# Patient Record
Sex: Female | Born: 1940 | Race: Black or African American | Hispanic: No | Smoking: Former smoker
Health system: Southern US, Community
[De-identification: ages and names within clinical notes are randomized; demographics above are authoritative.]

## PROBLEM LIST (undated history)

## (undated) DIAGNOSIS — E119 Type 2 diabetes mellitus without complications: Secondary | ICD-10-CM

## (undated) DIAGNOSIS — Z8639 Personal history of other endocrine, nutritional and metabolic disease: Secondary | ICD-10-CM

## (undated) DIAGNOSIS — R519 Headache, unspecified: Secondary | ICD-10-CM

## (undated) DIAGNOSIS — E785 Hyperlipidemia, unspecified: Secondary | ICD-10-CM

## (undated) DIAGNOSIS — L309 Dermatitis, unspecified: Secondary | ICD-10-CM

## (undated) DIAGNOSIS — R42 Dizziness and giddiness: Secondary | ICD-10-CM

## (undated) DIAGNOSIS — F419 Anxiety disorder, unspecified: Secondary | ICD-10-CM

## (undated) DIAGNOSIS — G629 Polyneuropathy, unspecified: Secondary | ICD-10-CM

## (undated) DIAGNOSIS — F329 Major depressive disorder, single episode, unspecified: Secondary | ICD-10-CM

## (undated) DIAGNOSIS — F32A Depression, unspecified: Secondary | ICD-10-CM

## (undated) DIAGNOSIS — I011 Acute rheumatic endocarditis: Secondary | ICD-10-CM

## (undated) DIAGNOSIS — I10 Essential (primary) hypertension: Secondary | ICD-10-CM

## (undated) HISTORY — DX: Dermatitis, unspecified: L30.9

## (undated) HISTORY — DX: Polyneuropathy, unspecified: G62.9

## (undated) HISTORY — PX: TONSILLECTOMY: SUR1361

## (undated) HISTORY — DX: Hyperlipidemia, unspecified: E78.5

## (undated) HISTORY — DX: Anxiety disorder, unspecified: F41.9

## (undated) HISTORY — PX: BRAIN SURGERY: SHX531

## (undated) HISTORY — DX: Headache, unspecified: R51.9

## (undated) HISTORY — DX: Essential (primary) hypertension: I10

## (undated) HISTORY — DX: Acute rheumatic endocarditis: I01.1

## (undated) HISTORY — PX: SHOULDER SURGERY: SHX246

## (undated) HISTORY — DX: Depression, unspecified: F32.A

## (undated) HISTORY — DX: Personal history of other endocrine, nutritional and metabolic disease: Z86.39

## (undated) HISTORY — PX: APPENDECTOMY: SHX54

---
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
## 2018-09-25 ENCOUNTER — Ambulatory Visit: Payer: Medicare Other | Admitting: Neurology

## 2018-09-26 ENCOUNTER — Encounter: Payer: Self-pay | Admitting: *Deleted

## 2018-09-30 ENCOUNTER — Encounter: Payer: Self-pay | Admitting: Neurology

## 2018-09-30 ENCOUNTER — Ambulatory Visit: Payer: Medicare Other | Admitting: Neurology

## 2018-09-30 ENCOUNTER — Other Ambulatory Visit: Payer: Self-pay

## 2018-09-30 VITALS — BP 111/66 | HR 77 | Temp 97.5°F | Ht 62.5 in | Wt 194.6 lb

## 2018-09-30 DIAGNOSIS — D8989 Other specified disorders involving the immune mechanism, not elsewhere classified: Secondary | ICD-10-CM | POA: Diagnosis not present

## 2018-09-30 DIAGNOSIS — M359 Systemic involvement of connective tissue, unspecified: Secondary | ICD-10-CM

## 2018-09-30 DIAGNOSIS — E785 Hyperlipidemia, unspecified: Secondary | ICD-10-CM

## 2018-09-30 DIAGNOSIS — R29898 Other symptoms and signs involving the musculoskeletal system: Secondary | ICD-10-CM

## 2018-09-30 DIAGNOSIS — Z79899 Other long term (current) drug therapy: Secondary | ICD-10-CM

## 2018-09-30 DIAGNOSIS — G990 Autonomic neuropathy in diseases classified elsewhere: Secondary | ICD-10-CM

## 2018-09-30 DIAGNOSIS — G908 Other disorders of autonomic nervous system: Secondary | ICD-10-CM

## 2018-09-30 DIAGNOSIS — G629 Polyneuropathy, unspecified: Secondary | ICD-10-CM

## 2018-09-30 DIAGNOSIS — M0609 Rheumatoid arthritis without rheumatoid factor, multiple sites: Secondary | ICD-10-CM

## 2018-09-30 DIAGNOSIS — E1369 Other specified diabetes mellitus with other specified complication: Secondary | ICD-10-CM

## 2018-09-30 DIAGNOSIS — R42 Dizziness and giddiness: Secondary | ICD-10-CM

## 2018-09-30 NOTE — Patient Instructions (Addendum)
Blood work today, come back for cholesterol panel Stop Statin Temporarily 2 months Refer to Rheumatology Refer to physical therapy Continue the IVIG

## 2018-09-30 NOTE — Progress Notes (Signed)
°[facility] ° ° ° °Provider:  Dr Ahern °Requesting Provider: White, David L, MD °Primary Care Provider:  Hagler, Rachel, MD ° °CC:  neuropathy ° °HPI:  Sarah Whitaker is a 78 y.o. female here as requested by White, David L, MD for neuropathy.  Past medical history diabetes, rheumatoid arthritis, hyperlipidemia, hypertension. 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 She can walk now, not using the cane as much, She never had pain more weakness in the legs, started acutely confirmed by extensive workup by neurology in [place]. She still feels weak but significantly improved. She never had sensory changes, all weakness, in the legs but not in the hands and did not affect the hands. Denies any numbness. Ankles stil stiff. Prior to acute symptoms no changes, no changes in medications, no falls. Doing well otherwise.  ° °Reviewed notes, labs and imaging from outside physicians, which showed: ° °Reviewed notes from [facility] [place] in [place].  Patient is new to the area and is establishing care.  Patient presented with bilateral acute leg weakness.  She has a past medical history of hyperlipidemia, diabetes and rheumatoid arthritis who presented for evaluation of new weakness in March 2020.  In January 2020 she developed relatively acute to subacute onset of weakness in both lower extremities.  This began without any clear provocation but is worsened over the month.  Now experiencing moderately severe constant and chronic weakness of both lower extremities.  Her weakness is worse when she is on her feet and fully goes away.  She is having to use cane to walk and prior to January she was able to walk without any assistance.  No family history of similar things.  She has no prior history of similar symptoms.  No changes in sensation such as weakness, tingling or numbness.  No significant  pain in the back of the legs themselves.  Right side appears more affected than the left but both sides are affected.  She underwent a laboratory evaluation largely normal with a CK of 325.  She is on lovastatin and methotrexate.  She is retired.  She is right-handed.  Neurologic exam was unremarkable except for 4- out of 5 strength in the bilateral tibialis anterior, no atrophy or fasciculations noted, intact to light touch, pinprick and vibration, no dysmetria, normal toe and heel walking normal tandem great, absent at ankles but otherwise DTRs 2+.  EMG nerve conduction study was obtained.  So was MRI of the lumbar spine.  She is on methotrexate for rheumatoid arthritis.  She was continued on lovastatin. ° °Personally reviewed MRI of the low spine which showed as above noted.  Reviewed EMG nerve conduction study data the bilateral tibialis anterior muscles and medial gastroc muscles demonstrated 2+ to 3+ insertional activity with markedly reduced recruitment and increased motor unit amplitude and duration.  The bilateral vastus lateralis and vastus medialis muscles demonstrated rare insertional activity with mildly increased motor unit amplitudes.  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  CTA of the head showed no acute intracranial abnormalities, mild chronic senescent changes, remote left craniotomy and underlying aneurysm repair changes.  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  EMG nerve conduction study showed severe, active in chronic sensorimotor polyneuropathy, inflammatory or autoimmune, toxic or metabolic, or infectious etiology   would appear most likely.  A lumbar puncture was ordered and lab work was ordered.  Lumbar puncture showed slightly elevated glucose at 57. ° °Labs ordered included ANA screen positive titer and  pattern 1-1 60 speckled, double-stranded DNA 48 c-ANCA negative, p-ANCA negative, CRP 2.1, ESR 78, serum immunoelectrophoresis showed a questionable faint monoclonal band present in the gamma region of IgG kappa, Lyme disease negative, paraneoplastic antibody found neuronal ACH R ganglionic neuronal antibodies high. °Review of Systems: °Patient complains of symptoms per HPI as well as the following symptoms ; leg pain and weakness. Pertinent negatives and positives per HPI. All others negative. ° ° °Social History  ° °Socioeconomic History  °• Marital status: Single  °  Spouse name: Not on file  °• Number of children: 1  °• Years of education: Not on file  °• Highest education level: Not on file  °Occupational History  °  Comment: retired  °Social Needs  °• Financial resource strain: Not on file  °• Food insecurity  °  Worry: Not on file  °  Inability: Not on file  °• Transportation needs  °  Medical: Not on file  °  Non-medical: Not on file  °Tobacco Use  °• Smoking status: Former Smoker  °• Smokeless tobacco: Never Used  °Substance and Sexual Activity  °• Alcohol use: Never  °  Frequency: Never  °• Drug use: Never  °• Sexual activity: Not on file  °Lifestyle  °• Physical activity  °  Days per week: Not on file  °  Minutes per session: Not on file  °• Stress: Not on file  °Relationships  °• Social connections  °  Talks on phone: Not on file  °  Gets together: Not on file  °  Attends religious service: Not on file  °  Active member of club or organization: Not on file  °  Attends meetings of clubs or organizations: Not on file  °  Relationship status: Not on file  °• Intimate partner violence  °  Fear of current or ex partner: Not on file  °  Emotionally abused: Not on file  °  Physically abused: Not on file  °  Forced sexual activity: Not on file  °Other Topics Concern  °• Not on file  °Social History Narrative  °• Not on file  ° ° °History reviewed. No pertinent family history. ° °Past Medical History:  °Diagnosis  Date  °• Anxiety   °• Depression   °• Diabetes (HCC)   °• Headache   °• Hyperlipidemia   °• Hypertension   °• Neuropathy   °• Rheumatoid aortitis   ° ° °There are no active problems to display for this patient. ° ° °Past Surgical History:  °Procedure Laterality Date  °• APPENDECTOMY    °• BRAIN SURGERY    °• SHOULDER SURGERY Right   ° ° °Current Outpatient Medications  °Medication Sig Dispense Refill  °• aspirin EC 81 MG tablet Take 81 mg by mouth daily.    °• atorvastatin (LIPITOR) 20 MG tablet Take 20 mg by mouth daily.    °• b complex vitamins tablet Take 1 tablet by mouth daily.    °• folic acid (FOLVITE) 1 MG tablet Take 1 mg by mouth daily.    °• lisinopril-hydrochlorothiazide (ZESTORETIC) 20-25 MG tablet Take 1 tablet by mouth daily.    °• metFORMIN (GLUCOPHAGE) 500 MG tablet Take 500 mg by mouth daily.     °• methotrexate (RHEUMATREX) 2.5 MG tablet   Take 2.5 mg by mouth once a week. Caution:Chemotherapy. Protect from light. °Take 7 tabs weekly    °• UNABLE TO FIND Take 1 tablet by mouth daily. Med Name: Gamma Guard    °• calcium carbonate (TUMS - DOSED IN MG ELEMENTAL CALCIUM) 500 MG chewable tablet Chew 1 tablet by mouth daily.    °• lovastatin (MEVACOR) 20 MG tablet Take 20 mg by mouth at bedtime.    °• meloxicam (MOBIC) 15 MG tablet Take 15 mg by mouth daily.    ° °No current facility-administered medications for this visit.   ° ° °Allergies as of 09/30/2018 - Review Complete 09/30/2018  °Allergen Reaction Noted  °• Latex  09/26/2018  °• Other  09/26/2018  °• Penicillins  09/26/2018  °• Sulfa antibiotics  09/26/2018  °• Tetanus toxoids  09/26/2018  ° ° °Vitals: °BP 111/66    Pulse 77    Temp (!) 97.5 °F (36.4 °C) (Oral)    Ht 5' 2.5" (1.588 m)    Wt 194 lb 9.6 oz (88.3 kg)    BMI 35.03 kg/m²  °Last Weight:  °Wt Readings from Last 1 Encounters:  °09/30/18 194 lb 9.6 oz (88.3 kg)  ° °Last Height:   °Ht Readings from Last 1 Encounters:  °09/30/18 5' 2.5" (1.588 m)  ° ° ° °Physical exam: °Exam: °Gen: NAD,  conversant, well nourised, obese, well groomed                     °CV: RRR, no MRG. No Carotid Bruits. No peripheral edema, warm, nontender °Eyes: Conjunctivae clear without exudates or hemorrhage ° °Neuro: °Detailed Neurologic Exam ° °Speech: °   Speech is normal; fluent and spontaneous with normal comprehension.  °Cognition: ° °  The patient is oriented to person, place, and time;  °   recent and remote memory intact;  °   language fluent;  °   normal attention, concentration,  °   fund of knowledge °Cranial Nerves: °   The pupils are equal, round, and reactive to light. Attempted fundoscopic exam could not visualize due to small pupils.  Visual fields are full to finger confrontation. Extraocular movements are intact. Trigeminal sensation is intact and the muscles of mastication are normal. The face is symmetric. The palate elevates in the midline. Hearing intact. Voice is normal. Shoulder shrug is normal. The tongue has normal motion without fasciculations.  ° °Coordination: °   No dysmetria ° °Gait: °   Slightly wide based with imbalance, slowed and cautious with cane ° °Motor Observation: °   No asymmetry, no atrophy, and no involuntary movements noted. °Tone: °   Normal muscle tone.   ° °Posture: °   Posture is normal. normal erect °   °Strength: °   Strength is V/V in the upper and lower limbs.  °    °Sensation: intact to LT °    °Reflex Exam: ° °DTR's: °   Absent AJs otherwise deep tendon reflexes in the upper and lower extremities are normal bilaterally.   °Toes: °   The toes are downgoing bilaterally.   °Clonus: °   Clonus is absent. °  ° °Assessment/Plan: 78-year-old patient with a past medical history of diabetes, hyperlipidemia, hypertension, rheumatoid arthritis on methotrexate who was previously managed by neurology for acute to subacute onset of weakness in the legs, EMG nerve conduction study demonstrated a relatively severe, active and chronic sensorimotor polyneuropathy, and  inflammatory/autoimmune/toxic/metabolic and infectious work-up was completed which revealed elevated ESR   ESR CRP, positive ANA and weakly positive SPEP IgG likely related to her known rheumatoid arthritis.  Also found to have a mildly elevated a CHR ganglionic neuronal antibody.  Her LP showed elevated protein of 57 but otherwise normal.  She was treated with IVIG.  Try temporary stopping the statin but has to watch cholesterol very closely. Repeat some labwork come back in the morning. She has a pcp appointment in August and can discuss at that time, I will let Dr. Mannie Stabile know. Can restart statin in a few months and see if symptoms worsen. In the meantime continue the IVIG.  Please teach Epley Maneuvers for BPPV and also address leg weakness now on IVIG for autoimmune neuropathy  Orders Placed This Encounter  Procedures   CK   B12 and Folate Panel   Vitamin B1   Methylmalonic acid, serum   Sedimentation rate   RPR   Heavy metals, blood   Vitamin B6   Multiple Myeloma Panel (SPEP&IFE w/QIG)   CBC   Comprehensive metabolic panel   Lipid panel   Hemoglobin A1c   TSH   Ambulatory referral to Rheumatology   Ambulatory referral to Physical Therapy     Cc: Ezequiel Essex, MD,    Sarina Ill, MD  [facility] [address] [place], [place] [address]  Phone [phone] Fax [phone]

## 2018-10-03 LAB — COMPREHENSIVE METABOLIC PANEL
ALT: 30 IU/L (ref 0–32)
AST: 41 IU/L — ABNORMAL HIGH (ref 0–40)
Albumin/Globulin Ratio: 1 — ABNORMAL LOW (ref 1.2–2.2)
Albumin: 3.9 g/dL (ref 3.7–4.7)
Alkaline Phosphatase: 94 IU/L (ref 39–117)
BUN/Creatinine Ratio: 17 (ref 12–28)
BUN: 12 mg/dL (ref 8–27)
Bilirubin Total: 0.7 mg/dL (ref 0.0–1.2)
CO2: 20 mmol/L (ref 20–29)
Calcium: 9.4 mg/dL (ref 8.7–10.3)
Chloride: 96 mmol/L (ref 96–106)
Creatinine, Ser: 0.72 mg/dL (ref 0.57–1.00)
GFR calc Af Amer: 94 mL/min/{1.73_m2} (ref >59)
GFR calc non Af Amer: 82 mL/min/{1.73_m2} (ref >59)
Globulin, Total: 4 g/dL (ref 1.5–4.5)
Glucose: 67 mg/dL (ref 65–99)
Potassium: 4.1 mmol/L (ref 3.5–5.2)
Sodium: 135 mmol/L (ref 134–144)
Total Protein: 7.9 g/dL (ref 6.0–8.5)

## 2018-10-03 LAB — MULTIPLE MYELOMA PANEL, SERUM
Albumin SerPl Elph-Mcnc: 3.7 g/dL (ref 2.9–4.4)
Albumin/Glob SerPl: 0.9 (ref 0.7–1.7)
Alpha 1: 0.3 g/dL (ref 0.0–0.4)
Alpha2 Glob SerPl Elph-Mcnc: 0.4 g/dL (ref 0.4–1.0)
B-Globulin SerPl Elph-Mcnc: 1.3 g/dL (ref 0.7–1.3)
Gamma Glob SerPl Elph-Mcnc: 2.2 g/dL — ABNORMAL HIGH (ref 0.4–1.8)
Globulin, Total: 4.2 g/dL — ABNORMAL HIGH (ref 2.2–3.9)
IgA/Immunoglobulin A, Serum: 362 mg/dL (ref 64–422)
IgG (Immunoglobin G), Serum: 2470 mg/dL — ABNORMAL HIGH (ref 586–1602)
IgM (Immunoglobulin M), Srm: 60 mg/dL (ref 26–217)

## 2018-10-03 LAB — CK: Total CK: 314 U/L — ABNORMAL HIGH (ref 32–182)

## 2018-10-03 LAB — CBC
Hematocrit: 29 % — ABNORMAL LOW (ref 34.0–46.6)
Hemoglobin: 10 g/dL — ABNORMAL LOW (ref 11.1–15.9)
MCH: 34.5 pg — ABNORMAL HIGH (ref 26.6–33.0)
MCHC: 34.5 g/dL (ref 31.5–35.7)
MCV: 100 fL — ABNORMAL HIGH (ref 79–97)
Platelets: 269 10*3/uL (ref 150–450)
RBC: 2.9 x10E6/uL — ABNORMAL LOW (ref 3.77–5.28)
RDW: 17.9 % — ABNORMAL HIGH (ref 11.7–15.4)
WBC: 3.4 10*3/uL (ref 3.4–10.8)

## 2018-10-03 LAB — HEAVY METALS, BLOOD
Arsenic: 7 ug/L (ref 2–23)
Lead, Blood: NOT DETECTED ug/dL (ref 0–4)
Mercury: 1.4 ug/L (ref 0.0–14.9)

## 2018-10-03 LAB — METHYLMALONIC ACID, SERUM: Methylmalonic Acid: 124 nmol/L (ref 0–378)

## 2018-10-03 LAB — TSH: TSH: 2.04 u[IU]/mL (ref 0.450–4.500)

## 2018-10-03 LAB — VITAMIN B6: Vitamin B6: 9.1 ug/L (ref 2.0–32.8)

## 2018-10-03 LAB — B12 AND FOLATE PANEL
Folate: >20 ng/mL (ref >3.0)
Vitamin B-12: 1154 pg/mL (ref 232–1245)

## 2018-10-03 LAB — RPR: RPR Ser Ql: NONREACTIVE

## 2018-10-03 LAB — SEDIMENTATION RATE: Sed Rate: 51 mm/hr — ABNORMAL HIGH (ref 0–40)

## 2018-10-03 LAB — HEMOGLOBIN A1C
Est. average glucose Bld gHb Est-mCnc: <74 mg/dL
Hgb A1c MFr Bld: <4.2 % — ABNORMAL LOW (ref 4.8–5.6)

## 2018-10-03 LAB — VITAMIN B1: Thiamine: 176.4 nmol/L (ref 66.5–200.0)

## 2018-10-07 ENCOUNTER — Telehealth: Payer: Self-pay | Admitting: *Deleted

## 2018-10-07 NOTE — Telephone Encounter (Signed)
Spoke with pt and discussed lab results. She understands to follow up with PCP for anemia. She also understands the CK was slightly elevated but we will recheck at next appt and there was nothing concerning. Statin has been stopped. She verbalized appreciation. She wants to make sure she can get her fasting lipid panel checked this week while getting gammaguard treatments this week. She plans to come Wednesday. Pt also reported she was upset that she drove over here last Friday to get her lab and was unaware the office was closed. Stated the recording says open. Pt understands the office is open for calls only on Fridays from 8-12 and message would be passed to management per her request. She verbalized appreciation.

## 2018-10-07 NOTE — Telephone Encounter (Signed)
I would wait until she is done the treatment, I'm not sure if it will interfere with the lipid panel. I would wait a few weeks after the treatment thanks.

## 2018-10-07 NOTE — Telephone Encounter (Signed)
-----   Message from Melvenia Beam, MD sent at 10/05/2018  7:38 PM EDT ----- Patient is anemic, needs follow up with primary care. Otherwise CK is slightly elevated but we stopped her statin and we will recheck her muscle enzymes at next appointment. Nothing concenring. thanks

## 2018-10-08 ENCOUNTER — Telehealth: Payer: Self-pay

## 2018-10-08 NOTE — Telephone Encounter (Signed)
I called the pt and LVM (ok per DPR) advising per Dr. Jaynee Eagles, she would wait a few weeks after the gammaguard treatment to get the lipid panel drawn as she is unsure if this would interefere with the lipid panel. I left office number for call back if she has any questions. I encouraged her to call ahead before getting the lab to confirm lab hours.

## 2018-10-08 NOTE — Telephone Encounter (Signed)
Left a voicemail asking the patient to obtain her records from Rheumatology so that Dr. Jaynee Eagles can look at them. I asked that she return my call with an update. Office number was provided.

## 2018-10-14 ENCOUNTER — Telehealth: Payer: Self-pay | Admitting: *Deleted

## 2018-10-14 NOTE — Telephone Encounter (Signed)
R/C medical records from [facility]. Pt medical records on [place]. Pt has 12/16/18.

## 2018-10-24 ENCOUNTER — Other Ambulatory Visit: Payer: Self-pay

## 2018-10-24 ENCOUNTER — Ambulatory Visit: Payer: Medicare Other | Attending: Neurology | Admitting: Rehabilitative and Restorative Service Providers"

## 2018-10-24 ENCOUNTER — Encounter: Payer: Self-pay | Admitting: Rehabilitative and Restorative Service Providers"

## 2018-10-24 DIAGNOSIS — M6281 Muscle weakness (generalized): Secondary | ICD-10-CM | POA: Insufficient documentation

## 2018-10-24 DIAGNOSIS — R2689 Other abnormalities of gait and mobility: Secondary | ICD-10-CM | POA: Diagnosis not present

## 2018-10-24 DIAGNOSIS — R2681 Unsteadiness on feet: Secondary | ICD-10-CM | POA: Insufficient documentation

## 2018-10-24 NOTE — Patient Instructions (Signed)
Access Code: [email or web address]  URL: [email or web address]  Date: 10/24/2018  Prepared by: Rudell Cobb   Exercises Gastroc Stretch on Wall - 2 reps - 1 sets - 30 seconds hold - 1-2x daily - 7x weekly Seated Toe Raise - 10 reps - 2 sets - 3 seconds hold - 1-2x daily - 7x weekly Seated Heel Raise - 10 reps - 2 sets - 3 seconds hold - 1-2x daily - 7x weekly Sit to Stand with Armchair - 5 reps - 2 sets - 1-2x daily - 7x weekly

## 2018-10-24 NOTE — Therapy (Signed)
[facility] [address] [place], [place], [address] Phone: [phone]   Fax:  [phone]  Physical Therapy Evaluation  Patient Details  Name: Sarah Whitaker MRN: [id number] Date of Birth: 1940-11-18 Referring Provider (PT): Sarina Ill, MD  CLINIC OPERATION CHANGES: Outpatient Neuro Rehab is open at lower capacity following universal masking, social distancing, and patient screening.  The patient's COVID risk of complications score is 5.  Encounter Date: 10/24/2018  PT End of Session - 10/24/18 1537    Visit Number  1    Number of Visits  17    Date for PT Re-Evaluation  12/23/18    Authorization Type  [employer] medicare    PT Start Time  6063    PT Stop Time  1320    PT Time Calculation (min)  45 min    Equipment Utilized During Treatment  Gait belt    Activity Tolerance  Patient tolerated treatment well    Behavior During Therapy  WFL for tasks assessed/performed       Past Medical History:  Diagnosis Date  . Anxiety   . Depression   . Diabetes ([place])   . Headache   . Hyperlipidemia   . Hypertension   . Neuropathy   . Rheumatoid aortitis     Past Surgical History:  Procedure Laterality Date  . APPENDECTOMY    . BRAIN SURGERY    . SHOULDER SURGERY Right     There were no vitals filed for this visit.   Subjective Assessment - 10/24/18 1234    Subjective  The patinet reports she had vertigo x 6 -8 weeks and it has resolved.  She came in today to have her balance and strength assessed.  She reports "because of the vertigo, "I slept on my left side for 2 months and now the L shoulder won't stop hurting."  She denies any injury or strain.  The patient moved from [place] to be near her daughter.    Pertinent History  diabetes, rheumatoid arthritis, hyperlipidemia, hypertension. She was diagnosed with autoimmune/inflammatory polyneuropathy    Patient Stated Goals  She notes she has gotten better since starting  treatments for neuropathy.  Some days she is stronger than others and would like to get more consistent.  She did not need a cane before onset of neuropathy.    Currently in Pain?  Yes    Pain Location  Shoulder    Pain Orientation  Left    Pain Descriptors / Indicators  Aching    Pain Type  Acute pain    Pain Onset  1 to 4 weeks ago    Pain Frequency  Intermittent    Aggravating Factors   rotation, positioning it    Pain Relieving Factors  rest         [facility] PT Assessment - 10/24/18 1240      Assessment   Medical Diagnosis  vertigo, recent dx of polyneuropathy    Referring Provider (PT)  Sarina Ill, MD    Onset Date/Surgical Date  --   January 2020   Hand Dominance  Right    Prior Therapy  Yes, in [place].       Precautions   Precautions  Fall      Balance Screen   Has the patient fallen in the past 6 months  Yes    How many times?  1- slipped in the bathtub in March    Has the patient had a decrease in activity  level because of a fear of falling?   No   beginning to improve   Is the patient reluctant to leave their home because of a fear of falling?   No      Home Environment   Living Environment  Private residence    Living Arrangements  Children    Home Access  Level entry    Home Layout  One level    Home Equipment  Cane - quad;Tub bench      Prior Function   Level of Independence  Independent    Vocation  Retired      Education administrator  Appears Intact      ROM / Strength   AROM / PROM / Strength  AROM;Strength      AROM   Overall AROM   Within functional limits for tasks performed    Overall AROM Comments  Has slower movement on the L for shoulder ER, but able to complete with mild achiness in deltoid region      Strength   Strength Assessment Site  Shoulder;Elbow;Hip;Knee;Ankle    Right/Left Shoulder  Right;Left    Right Shoulder Flexion  4+/5    Right Shoulder ABduction  4+/5    Left Shoulder Flexion  3+/5   pain   Left Shoulder ABduction   3+/5   pain   Right/Left Elbow  Right;Left    Right Elbow Flexion  5/5    Right Elbow Extension  5/5    Left Elbow Flexion  5/5    Left Elbow Extension  5/5    Right/Left Hip  Right;Left    Right Hip Flexion  4/5    Left Hip Flexion  4/5    Right/Left Knee  Right;Left    Right Knee Flexion  5/5    Right Knee Extension  4/5    Left Knee Flexion  5/5    Left Knee Extension  5/5    Right/Left Ankle  Right;Left    Right Ankle Dorsiflexion  3-/5    Left Ankle Dorsiflexion  3-/5      Ambulation/Gait   Ambulation/Gait  Yes    Ambulation/Gait Assistance  4: Min guard    Ambulation Distance (Feet)  200 Feet    Assistive device  Small based quad cane    Gait Pattern  Decreased stride length;Poor foot clearance - left;Poor foot clearance - right;Shuffle;Left foot flat;Right foot flat;Decreased dorsiflexion - left;Decreased dorsiflexion - right;Step-through pattern    Gait velocity  1.46 ft/sec     Stairs  Yes    Stairs Assistance  4: Min guard    Stair Management Technique  Alternating pattern;Two rails;Step to pattern      Standardized Balance Assessment   Standardized Balance Assessment  Berg Balance Test      Berg Balance Test   Sit to Stand  Able to stand without using hands and stabilize independently    Standing Unsupported  Able to stand safely 2 minutes    Sitting with Back Unsupported but Feet Supported on Floor or Stool  Able to sit safely and securely 2 minutes    Stand to Sit  Sits safely with minimal use of hands    Transfers  Able to transfer safely, definite need of hands    Standing Unsupported with Eyes Closed  Able to stand 10 seconds with supervision    Standing Unsupported with Feet Together  Able to place feet together independently and stand 1 minute safely  From Standing, Reach Forward with Outstretched Arm  Can reach forward >5 cm safely (2")    From Standing Position, Pick up Object from Floor  Able to pick up shoe, needs supervision    From Standing  Position, Turn to Look Behind Over each Shoulder  Turn sideways only but maintains balance    Turn 360 Degrees  Needs close supervision or verbal cueing    Standing Unsupported, Alternately Place Feet on Step/Stool  Able to complete >2 steps/needs minimal assist    Standing Unsupported, One Foot in Front  Able to take small step independently and hold 30 seconds    Standing on One Leg  Unable to try or needs assist to prevent fall    Total Score  37    Berg comment:  37/56 indicating high fall risk           Vestibular Assessment - 10/24/18 1302      Vestibular Assessment   General Observation  The patient reports vertigo resolved.        Vestibulo-Ocular Reflex   VOR 1 Head Only (x 1 viewing)  slow VOR- able to perform without c/o dizziness      Positional Testing   Sidelying Test  Sidelying Right;Sidelying Left      Sidelying Right   Sidelying Right Duration  0    Sidelying Right Symptoms  No nystagmus      Sidelying Left   Sidelying Left Duration  0    Sidelying Left Symptoms  No nystagmus          Objective measurements completed on examination: See above findings.              PT Education - 10/24/18 1536    Education Details  medbridge ther ex    Person(s) Educated  Patient    Methods  Explanation;Demonstration;Handout    Comprehension  Verbalized understanding;Returned demonstration       PT Short Term Goals - 10/24/18 1538      PT SHORT TERM GOAL #1   Title  The patient will be indep with HEP for LE strengthening, balance, and general mobility.    Time  4    Period  Weeks    Target Date  11/23/18      PT SHORT TERM GOAL #2   Title  The patient will improve Berg score from 37/56 to > or equal to 43/56 to demo improving balance.    Time  4    Period  Weeks    Target Date  11/23/18      PT SHORT TERM GOAL #3   Title  The patient will improve gait speed from 1.46 ft/sec up to 1.8 ft/sec to demo reduced risk for falls.    Time  4     Period  Weeks    Target Date  11/23/18      PT SHORT TERM GOAL #4   Title  The patient will improve ankle DF to > or equal to 4/5 to be able to improve foot clearance during gait.    Time  4    Period  Weeks    Target Date  11/23/18        PT Long Term Goals - 10/24/18 1553      PT LONG TERM GOAL #1   Title  The patient will be indep with HEP for post d/c progression.    Time  8    Period  Weeks    Target Date  12/23/18      PT LONG TERM GOAL #2   Title  The patient will be able to improve Berg score from 37/56 to > or equal to 48/56 to demo dec'ing risk for falls.    Time  8    Period  Weeks    Target Date  12/23/18      PT LONG TERM GOAL #3   Title  The patient will improve gait speed from 1.46 ft/sec to > or equal to 2.2 ft/sec to demo improving gait speed.    Time  8    Period  Weeks    Target Date  12/23/18      PT LONG TERM GOAL #4   Title  The patient will negotiate 4 steps with one handrail and alternating pattern mod indep.    Time  8    Period  Weeks    Target Date  12/23/18             Plan - 10/24/18 1555    Clinical Impression Statement  The patient is a 78 year old female presenting to outpatient physical therapy with polyneuropathy onset in 03/2018.  She was referred for vertigo, however reports her main goals at this time are to improve balance and mobility.  She notes vertigo is resolved.  She scores in fall risk range per gait speed, Berg balance test and has LE weakness more pronounced in hip flexors and ankle DF/PF.  PT to address deficits to promote improved mobility.    Personal Factors and Comorbidities  Comorbidity 1;Comorbidity 2;Comorbidity 3+    Comorbidities  diabetes, RA, HTN    Examination-Activity Limitations  Locomotion Level;Transfers;Stairs    Examination-Participation Restrictions  Community Activity;Cleaning    Stability/Clinical Decision Making  Stable/Uncomplicated    Clinical Decision Making  Low    Rehab Potential  Good     PT Frequency  1x / week   *Patient requested 1x/week due to $35 copay   PT Duration  8 weeks    PT Treatment/Interventions  ADLs/Self Care Home Management;Neuromuscular re-education;Patient/family education;Gait training;Stair training;Functional mobility training;Therapeutic activities;Therapeutic exercise;Balance training;Vestibular    PT Next Visit Plan  Check on HEP, work on ankle strength and hip strengthening, balance activities in standing, work on safe use of assistive device.    Consulted and Agree with Plan of Care  Patient       Patient will benefit from skilled therapeutic intervention in order to improve the following deficits and impairments:  Abnormal gait, Decreased activity tolerance, Decreased strength, Pain, Decreased balance, Difficulty walking  Visit Diagnosis: Other abnormalities of gait and mobility  Muscle weakness (generalized)  Unsteadiness on feet     Problem List There are no active problems to display for this patient.   Valorie Mcgrory, PT 10/24/2018, 4:02 PM  [facility] [address] [place], [place], [address] Phone: [phone]   Fax:  [phone]  Name: Ivar BuryRamona Delaurentis MRN: [id number] Date of Birth: December 28, 1940

## 2018-10-31 ENCOUNTER — Ambulatory Visit: Payer: Medicare Other | Admitting: Rehabilitative and Restorative Service Providers"

## 2018-10-31 ENCOUNTER — Other Ambulatory Visit: Payer: Self-pay

## 2018-10-31 ENCOUNTER — Encounter: Payer: Self-pay | Admitting: Rehabilitative and Restorative Service Providers"

## 2018-10-31 DIAGNOSIS — R2689 Other abnormalities of gait and mobility: Secondary | ICD-10-CM | POA: Diagnosis not present

## 2018-10-31 DIAGNOSIS — R2681 Unsteadiness on feet: Secondary | ICD-10-CM

## 2018-10-31 DIAGNOSIS — M6281 Muscle weakness (generalized): Secondary | ICD-10-CM

## 2018-10-31 NOTE — Patient Instructions (Addendum)
Access Code: [email or web address]  URL: [email or web address]  Date: 10/31/2018  Prepared by: Rudell Cobb   Exercises Gastroc Stretch on Wall - 2 reps - 1 sets - 30 seconds hold - 1-2x daily - 7x weekly Seated Toe Raise - 10 reps - 2 sets - 3 seconds hold - 1-2x daily - 7x weekly Seated Heel Raise - 10 reps - 2 sets - 3 seconds hold - 1-2x daily - 7x weekly Sit to Stand with Armchair - 5 reps - 2 sets - 1-2x daily - 7x weekly                  Supine Bridge - 10 reps - 1 sets - 5 seconds hold - 1-2x daily - 7x weekly  Single point cane with quad tip:

## 2018-10-31 NOTE — Therapy (Signed)
[facility] [address] [place], [place], [address] Phone: [phone]   Fax:  [phone]  Physical Therapy Treatment  Patient Details  Name: Sarah Whitaker MRN: [id number] Date of Birth: 1940/11/25 Referring Provider (PT): Naomie Dean, MD   Encounter Date: 10/31/2018  PT End of Session - 10/31/18 1911    Visit Number  2    Number of Visits  17    Date for PT Re-Evaluation  12/23/18    Authorization Type  [employer]HC medicare    PT Start Time  1704    PT Stop Time  1750    PT Time Calculation (min)  46 min    Equipment Utilized During Treatment  Gait belt    Activity Tolerance  Patient tolerated treatment well    Behavior During Therapy  [facility] for tasks assessed/performed       Past Medical History:  Diagnosis Date  . Anxiety   . Depression   . Diabetes ([facility])   . Headache   . Hyperlipidemia   . Hypertension   . Neuropathy   . Rheumatoid aortitis     Past Surgical History:  Procedure Laterality Date  . APPENDECTOMY    . BRAIN SURGERY    . SHOULDER SURGERY Right     There were no vitals filed for this visit.  Subjective Assessment - 10/31/18 1707    Subjective  The patient arrives with her daughter.  She reports she has been doing exercises.    Patient Stated Goals  She notes she has gotten better since starting treatments for neuropathy.  Some days she is stronger than others and would like to get more consistent.  She did not need a cane before onset of neuropathy.    Currently in Pain?  No/denies         [facility] PT Assessment - 10/31/18 1725      Ambulation/Gait   Ambulation Distance (Feet)  230 Feet   115 x 3 reps   Assistive device  Small based quad cane;Straight cane    Gait Pattern  --   patient with increased heel strike   Ambulation Surface  Level;Indoor    Stairs  --                   [facility] Adult PT Treatment/Exercise - 10/31/18 1725      Ambulation/Gait   Ambulation/Gait  Yes     Ambulation/Gait Assistance  4: Min assist;4: Min guard    Ambulation/Gait Assistance Details  Focused on hitting heel strike and increasing stride length today.    Gait Comments  PT educated patient and her daughter regarding assistive device use.  She is using quad cane incorrectly and may benefit from single point cane with quad tip.        Neuro Re-ed    Neuro Re-ed Details   Wall bumps:  feet in stride performed standing rocking working o nlifting heels/toes for improved weight shift and transfer for pre-gait activiteis.      Exercises   Exercises  Other Exercises    Other Exercises   Sidelying hip abduction x 12 reps R and L sides, supine bridges x 12 reps, sit<.stand without UE support x 12 reps.  Attempted standing heel raises, however the patient is unable to life against gravity.  The patient and PT performed seated ankle DF/PF.             PT Education - 10/31/18 1910    Education Details  updated HEP adding bridges    Person(s) Educated  Patient;Child(ren)    Methods  Explanation;Demonstration;Handout    Comprehension  Verbalized understanding;Returned demonstration       PT Short Term Goals - 10/24/18 1538      PT SHORT TERM GOAL #1   Title  The patient will be indep with HEP for LE strengthening, balance, and general mobility.    Time  4    Period  Weeks    Target Date  11/23/18      PT SHORT TERM GOAL #2   Title  The patient will improve Berg score from 37/56 to > or equal to 43/56 to demo improving balance.    Time  4    Period  Weeks    Target Date  11/23/18      PT SHORT TERM GOAL #3   Title  The patient will improve gait speed from 1.46 ft/sec up to 1.8 ft/sec to demo reduced risk for falls.    Time  4    Period  Weeks    Target Date  11/23/18      PT SHORT TERM GOAL #4   Title  The patient will improve ankle DF to > or equal to 4/5 to be able to improve foot clearance during gait.    Time  4    Period  Weeks    Target Date  11/23/18         PT Long Term Goals - 10/24/18 1553      PT LONG TERM GOAL #1   Title  The patient will be indep with HEP for post d/c progression.    Time  8    Period  Weeks    Target Date  12/23/18      PT LONG TERM GOAL #2   Title  The patient will be able to improve Berg score from 37/56 to > or equal to 48/56 to demo dec'ing risk for falls.    Time  8    Period  Weeks    Target Date  12/23/18      PT LONG TERM GOAL #3   Title  The patient will improve gait speed from 1.46 ft/sec to > or equal to 2.2 ft/sec to demo improving gait speed.    Time  8    Period  Weeks    Target Date  12/23/18      PT LONG TERM GOAL #4   Title  The patient will negotiate 4 steps with one handrail and alternating pattern mod indep.    Time  8    Period  Weeks    Target Date  12/23/18            Plan - 10/31/18 1911    Clinical Impression Statement  The patinet has shown progress since initial evaluation.  PT to continue to work on progressing gait, LE strength and balance.  We trialed a SPC with quad tip today with improved reciprocal gait.  She carries the quad cane and it is a tripping hazard.  PT discussed device selection with her daughter.    PT Treatment/Interventions  ADLs/Self Care Home Management;Neuromuscular re-education;Patient/family education;Gait training;Stair training;Functional mobility training;Therapeutic activities;Therapeutic exercise;Balance training;Vestibular    PT Next Visit Plan  continue LE strength, balance recovery, rocker board for balance strategies, distal LE strenthening (toe/heel raises), gait with SPC building endurance    Consulted and Agree with Plan of Care  Patient       Patient  will benefit from skilled therapeutic intervention in order to improve the following deficits and impairments:  Abnormal gait, Decreased activity tolerance, Decreased strength, Pain, Decreased balance, Difficulty walking  Visit Diagnosis: Other abnormalities of gait and  mobility  Muscle weakness (generalized)  Unsteadiness on feet     Problem List There are no active problems to display for this patient.   McCool, PT 10/31/2018, 7:13 PM  [facility] [address] [place], [place], [address] Phone: [phone]   Fax:  [phone]  Name: Courteny Egler MRN: [id number] Date of Birth: 11/26/1940

## 2018-11-14 ENCOUNTER — Ambulatory Visit: Payer: Medicare Other | Admitting: Rehabilitative and Restorative Service Providers"

## 2018-11-19 ENCOUNTER — Telehealth: Payer: Self-pay | Admitting: Neurology

## 2018-11-19 ENCOUNTER — Other Ambulatory Visit: Payer: Self-pay | Admitting: Neurology

## 2018-11-19 NOTE — Telephone Encounter (Signed)
Pt states she has been told that she needs to see Dr Jaynee Eagles sooner than later because she will have been over 6 weeks without medication if she waits until current appointment date

## 2018-11-19 NOTE — Telephone Encounter (Signed)
Patient was told to continue the IVIG treatment which she was not getting from [place], that's what my last note said. I know she came from [place] but I recall she was getting IVIG ordered from Rheumatology so I am not sure what she means that she needs medication. Can you clarify where she last got the IVIG? If we need to get her set up then lets start the process asap. thanks

## 2018-11-19 NOTE — Telephone Encounter (Signed)
I spoke with the patient. She stated the treatments ordered by Dr. Dema Severin in [place] have been administered by [facility] which is "nationwide". She stated the orders were transferred to [facility]. Even though the approval was for one year, the pt stated they needed updates from her neurologist from time to time. Per pt, treatments are once a month and are one week long. Her last treatment was the week of 8/31. She does not want to miss the next one. I asked her to have [employer] reach out to [place] if possible so we know what information is needed. Last ov was 09/30/2018 and next ov is scheduled 12/16/2018 just under 3 months later. Pt stated she would call them and she has good contact with them. She will reach back out if needed. She verbalized appreciation.

## 2018-11-20 NOTE — Telephone Encounter (Signed)
I spoke with Dr. Jaynee Eagles and she reviewed the IVIG orders. They have been signed and faxed back to [employer] [facility]. Received a receipt of confirmation.

## 2018-11-20 NOTE — Telephone Encounter (Signed)
We received orders from [employer] in [place] for MD authorization. Phone: [phone]. Fax: [phone].   RX number: [id number].  Original Rx date: 09/18/2018 (Dr. Donovan Kail).  Doses allowed: 3.  Expires: 09/17/2019.   Prescription: GammaGARD 180 gm/1800 mL every month.  Administration instructions: Infuse a total of 180 gm/1800 mL IV divided over 5 days x 3 cycles via infusion pump rate or rate controlled device, infuse per recommended guidelines at rates provided on dosing rate sheet enclosed in medication bag. RN to titrate rate as tolerated by patient. Pre-medication: Acetaminophen 325 mg PO prior to infusion, Diphenhydramine 25 mg PO prior to infusion. Anaphylaxis kit/HH nursings/flushes/supplies per pharmacy protocol.   I will review this with Dr. Jaynee Eagles.

## 2018-11-21 ENCOUNTER — Other Ambulatory Visit: Payer: Self-pay

## 2018-11-21 ENCOUNTER — Encounter: Payer: Self-pay | Admitting: Rehabilitative and Restorative Service Providers"

## 2018-11-21 ENCOUNTER — Ambulatory Visit: Payer: Medicare Other | Attending: Neurology | Admitting: Rehabilitative and Restorative Service Providers"

## 2018-11-21 ENCOUNTER — Telehealth: Payer: Self-pay | Admitting: Neurology

## 2018-11-21 VITALS — BP 116/57 | HR 105

## 2018-11-21 DIAGNOSIS — M6281 Muscle weakness (generalized): Secondary | ICD-10-CM | POA: Insufficient documentation

## 2018-11-21 DIAGNOSIS — R2689 Other abnormalities of gait and mobility: Secondary | ICD-10-CM | POA: Diagnosis not present

## 2018-11-21 DIAGNOSIS — R2681 Unsteadiness on feet: Secondary | ICD-10-CM | POA: Diagnosis present

## 2018-11-21 NOTE — Therapy (Addendum)
[facility] [address] [place], [place], [address] Phone: [phone]   Fax:  [phone]  Physical Therapy Treatment and Discharge Summary  Patient Details  Name: Sarah Whitaker MRN: [id number] Date of Birth: 02-09-1941 Referring Provider (PT): Sarina Ill, MD   Encounter Date: 11/21/2018  PT End of Session - 11/21/18 1824    Visit Number  3    Number of Visits  17    Date for PT Re-Evaluation  12/23/18    Authorization Type  [employer] medicare    PT Start Time  4481    PT Stop Time  1900   PT did not bill for this full time-- medical emergency and called EMS   PT Time Calculation (min)  68 min    Equipment Utilized During Treatment  Gait belt    Activity Tolerance  Patient tolerated treatment well    Behavior During Therapy  [facility] for tasks assessed/performed       Past Medical History:  Diagnosis Date  . Anxiety   . Depression   . Diabetes ([place])   . Headache   . Hyperlipidemia   . Hypertension   . Neuropathy   . Rheumatoid aortitis     Past Surgical History:  Procedure Laterality Date  . APPENDECTOMY    . BRAIN SURGERY    . SHOULDER SURGERY Right     Vitals:   11/21/18 1822  BP: (!) 116/57  Pulse: (!) 105    Subjective Assessment - 11/21/18 1757    Subjective  The patient reports her apartment is ready.  She reports she is going to gradually transition to the apartment.  She notes she has been    Pertinent History  diabetes, rheumatoid arthritis, hyperlipidemia, hypertension. She was diagnosed with autoimmune/inflammatory polyneuropathy, aneurysm repair    Patient Stated Goals  She notes she has gotten better since starting treatments for neuropathy.  Some days she is stronger than others and would like to get more consistent.  She did not need a cane before onset of neuropathy.    Currently in Pain?  No/denies         [facility] PT Assessment - 11/21/18 1804      Berg Balance Test   Sit to Stand   Able to stand without using hands and stabilize independently    Standing Unsupported  Able to stand safely 2 minutes    Sitting with Back Unsupported but Feet Supported on Floor or Stool  Able to sit safely and securely 2 minutes    Stand to Sit  Sits safely with minimal use of hands    Transfers  Able to transfer safely, minor use of hands    Standing Unsupported with Eyes Closed  Able to stand 10 seconds safely    Standing Unsupported with Feet Together  Able to place feet together independently and stand 1 minute safely    From Standing, Reach Forward with Outstretched Arm  Can reach forward >5 cm safely (2")    From Standing Position, Pick up Object from Floor  Able to pick up shoe safely and easily    From Standing Position, Turn to Look Behind Over each Shoulder  Looks behind from both sides and weight shifts well    Turn 360 Degrees  Needs close supervision or verbal cueing    Standing Unsupported, Alternately Place Feet on Step/Stool  Able to complete >2 steps/needs minimal assist    Standing Unsupported, One Foot in Front  Able to  take small step independently and hold 30 seconds    Standing on One Leg  Tries to lift leg/unable to hold 3 seconds but remains standing independently    Total Score  43    Berg comment:  43/56 improved from 37/56                   [facility] Adult PT Treatment/Exercise - 11/21/18 1804      Ambulation/Gait   Ambulation/Gait  Yes    Ambulation/Gait Assistance  4: Min guard    Ambulation/Gait Assistance Details  Worked on heel strike and longer stride length.    Ambulation Distance (Feet)  230 Feet   100 ft x 2 reps   Assistive device  Straight cane   quad tip   Ambulation Surface  Level;Indoor      Exercises   Exercises  Other Exercises    Other Exercises   Step ups anterior x 5 reps with UE support intermittently and CGA for support.  Lateral step downs from 4" step with CGA for safety.                 PT Short Term Goals -  11/21/18 1812      PT SHORT TERM GOAL #1   Title  The patient will be indep with HEP for LE strengthening, balance, and general mobility.    Time  4    Period  Weeks    Target Date  11/23/18      PT SHORT TERM GOAL #2   Title  The patient will improve Berg score from 37/56 to > or equal to 43/56 to demo improving balance.    Baseline  Scored 43/56.    Time  4    Period  Weeks    Status  Achieved    Target Date  11/23/18      PT SHORT TERM GOAL #3   Title  The patient will improve gait speed from 1.46 ft/sec up to 1.8 ft/sec to demo reduced risk for falls.    Time  4    Period  Weeks    Target Date  11/23/18      PT SHORT TERM GOAL #4   Title  The patient will improve ankle DF to > or equal to 4/5 to be able to improve foot clearance during gait.    Baseline  Ankle DF is 3/5 bilaterally.    Time  4    Period  Weeks    Status  Not Met    Target Date  11/23/18        PT Long Term Goals - 10/24/18 1553      PT LONG TERM GOAL #1   Title  The patient will be indep with HEP for post d/c progression.    Time  8    Period  Weeks    Target Date  12/23/18      PT LONG TERM GOAL #2   Title  The patient will be able to improve Berg score from 37/56 to > or equal to 48/56 to demo dec'ing risk for falls.    Time  8    Period  Weeks    Target Date  12/23/18      PT LONG TERM GOAL #3   Title  The patient will improve gait speed from 1.46 ft/sec to > or equal to 2.2 ft/sec to demo improving gait speed.    Time  8    Period  Weeks    Target Date  12/23/18      PT LONG TERM GOAL #4   Title  The patient will negotiate 4 steps with one handrail and alternating pattern mod indep.    Time  8    Period  Weeks    Target Date  12/23/18            Plan - 11/21/18 1829    Clinical Impression Statement  Patient c/o a "lightheaded but not dizzy sensation" prior to end of session.  We monitored BP, HR and spO2.  The patient HR remained elevated and when we attempted to stand she  felt too weak to walk and asked to lie down.  She requested we call EMS due to not feeling well. The patient's STGs were not fully assessed due to medical change in status-- will complete at next session.    Comorbidities  diabetes, RA, HTN    PT Treatment/Interventions  ADLs/Self Care Home Management;Neuromuscular re-education;Patient/family education;Gait training;Stair training;Functional mobility training;Therapeutic activities;Therapeutic exercise;Balance training;Vestibular    PT Next Visit Plan  Check vitals at beginning of session; finish checking STGs. continue LE strength, balance recovery, rocker board for balance strategies, distal LE strenthening (toe/heel raises), gait with SPC building endurance    Consulted and Agree with Plan of Care  Patient;Family member/caregiver    Family Member Consulted  daughter       Patient will benefit from skilled therapeutic intervention in order to improve the following deficits and impairments:  Abnormal gait, Decreased activity tolerance, Decreased strength, Pain, Decreased balance, Difficulty walking  Visit Diagnosis: Other abnormalities of gait and mobility  Muscle weakness (generalized)  Unsteadiness on feet  UPDATE:  The patient did not return since this visit.  See note for patient's status at time of last encounter.   Problem List There are no active problems to display for this patient.   [place], PT 11/21/2018, 7:27 PM  [facility] [address] [place], [place], [address] Phone: [phone]   Fax:  [phone]  Name: Sarah Whitaker MRN: [id number] Date of Birth: 1940/03/13

## 2018-11-21 NOTE — Telephone Encounter (Signed)
Pt has called asking that RN calls her to confirm if the medical records for her have been received and forwarded to Dr Jaynee Eagles.  Pt was sent initially to Medical Records but called right back stating she was unable to reach anyone and that she really wants to hear from RN on if the records have been sent over yet to her and Dr Jaynee Eagles

## 2018-11-21 NOTE — Telephone Encounter (Signed)
I spoke with the pt. She stated she had just gotten a call from a nurse regarding setting up IVIG. Pt aware Dr. Jaynee Eagles had received the orders and they were faxed back to [facility]. She verbalized appreciation and had no further concerns. She will f/u at next ov scheduled for 12/16/2018.

## 2018-11-26 ENCOUNTER — Telehealth: Payer: Self-pay

## 2018-11-26 NOTE — Telephone Encounter (Signed)
Patient called needing a refill on her methotrexate (RHEUMATREX) 2.5 MG tablet. She stated that she has taken her last pill and will not need anymore until Monday 12-02-2018.

## 2018-11-27 NOTE — Telephone Encounter (Signed)
I spoke with the pt and discussed rheumatology had not accepted her as a pt yet because they did not receive her records from previous rheumatologist. Pt stated she would call tomorrow to get them sent. Pt given [facility]'s number. She will also see if PCP will give temporary refill until she gets in with rheumatology.

## 2018-11-27 NOTE — Telephone Encounter (Signed)
That's fine would you clarify her dose (its usually weekly and more than one pill) and order please thanks

## 2018-11-28 ENCOUNTER — Ambulatory Visit: Payer: Medicare Other | Admitting: Rehabilitative and Restorative Service Providers"

## 2018-11-28 NOTE — Telephone Encounter (Signed)
I spoke with the patient. She was able to get her Methotrexate refilled by PCP for now. She stated she is working on  Having her records sent to rheumatology for the referral. She verbalized appreciation for the call. I encouraged her to call back if she needs [place].

## 2018-12-05 ENCOUNTER — Encounter: Payer: Medicare Other | Admitting: Rehabilitative and Restorative Service Providers"

## 2018-12-16 ENCOUNTER — Other Ambulatory Visit: Payer: Self-pay

## 2018-12-16 ENCOUNTER — Ambulatory Visit: Payer: Medicare Other | Admitting: Neurology

## 2018-12-16 ENCOUNTER — Encounter: Payer: Self-pay | Admitting: Neurology

## 2018-12-16 ENCOUNTER — Encounter

## 2018-12-16 VITALS — BP 119/63 | HR 78 | Temp 97.7°F | Ht 62.5 in | Wt 189.0 lb

## 2018-12-16 DIAGNOSIS — E669 Obesity, unspecified: Secondary | ICD-10-CM

## 2018-12-16 DIAGNOSIS — G629 Polyneuropathy, unspecified: Secondary | ICD-10-CM | POA: Insufficient documentation

## 2018-12-16 DIAGNOSIS — Z79899 Other long term (current) drug therapy: Secondary | ICD-10-CM | POA: Insufficient documentation

## 2018-12-16 NOTE — Progress Notes (Signed)
[facility]    Provider:  Dr Jaynee Eagles Requesting Provider: Caren Macadam, MD Primary Care Provider:  Caren Macadam, MD  CC:  Neuropathy  Interval update She is here for followup on neuropathy and IVIG: Her vertigo is better. She went to PT for vestibular therapy and the vertigo was better. The second time she went her pulse went up so she did not go back. Overall she continues to improve with neuropathy and vertigo, she feels a little weak after IVIG and it takes some time to feel better, it take her 3 days to get back to herself, bu tthen she feels great. She is a hard stick, since we will be continuing this we will insert a  port, it is terrible getting the injections per patient and she would like a port. No difference being off of the statin, restart. Feels better on iron. She worries about her memory, no family history Discussed normal cognitive aging. Will keep IVIG every month over 5 days, discussed increasing time in between IVIG treatments from 4 to 5 weeks when she is read and also decreasing how many days it takes to get her IVIG from 5 to 3 but right now will leave it as is.     HPI:  Sarah Whitaker is a 78 y.o. female here as requested by Caren Macadam, MD for neuropathy.  Past medical history diabetes, rheumatoid arthritis, hyperlipidemia, hypertension. 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 She can walk now, not using the cane as much, She never had pain more weakness in the legs, started acutely confirmed by extensive workup by neurology in [facility]. She still feels weak but significantly improved. She never had sensory changes, all weakness, in the legs but not in the hands and did not affect the hands. Denies any numbness. Ankles stil stiff. Prior to acute symptoms no changes, no changes in medications, no falls. Doing well otherwise.   Reviewed notes, labs and imaging from  outside physicians, which showed:  Reviewed notes from [facility] in [place].  Patient is new to the area and is establishing care.  Patient presented with bilateral acute leg weakness.  She has a past medical history of hyperlipidemia, diabetes and rheumatoid arthritis who presented for evaluation of new weakness in March 2020.  In January 2020 she developed relatively acute to subacute onset of weakness in both lower extremities.  This began without any clear provocation but is worsened over the month.  Now experiencing moderately severe constant and chronic weakness of both lower extremities.  Her weakness is worse when she is on her feet and fully goes away.  She is having to use cane to walk and prior to January she was able to walk without any assistance.  No family history of similar things.  She has no prior history of similar symptoms.  No changes in sensation such as weakness, tingling or numbness.  No significant pain in the back of the legs themselves.  Right side appears more affected than the left but both sides are affected.  She underwent a laboratory evaluation largely normal with a CK of 325.  She is on lovastatin and methotrexate.  She is retired.  She is right-handed.  Neurologic exam was unremarkable except for 4- out of 5 strength in the bilateral tibialis anterior, no atrophy or fasciculations noted, intact to light touch, pinprick and vibration, no dysmetria, normal toe and heel walking normal tandem great,  absent at ankles but otherwise DTRs 2+.  EMG nerve conduction study was obtained.  So was MRI of the lumbar spine.  She is on methotrexate for rheumatoid arthritis.  She was continued on lovastatin.  Personally reviewed MRI of the low spine which showed as above noted.  Reviewed EMG nerve conduction study data the bilateral tibialis anterior muscles and medial gastroc muscles demonstrated 2+ to 3+ insertional activity with markedly reduced  recruitment and increased motor unit amplitude and duration.  The bilateral vastus lateralis and vastus medialis muscles demonstrated rare insertional activity with mildly increased motor unit amplitudes.  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  CTA of the head showed no acute intracranial abnormalities, mild chronic senescent changes, remote left craniotomy and underlying aneurysm repair changes.  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  EMG nerve conduction study showed severe, active in chronic sensorimotor polyneuropathy, inflammatory or autoimmune, toxic or metabolic, or infectious etiology would appear most likely.  A lumbar puncture was ordered and lab work was ordered.  Lumbar puncture showed slightly elevated glucose at 57.  Labs ordered included ANA screen positive titer and pattern 1-1 60 speckled, double-stranded DNA 48 c-ANCA negative, p-ANCA negative, CRP 2.1, ESR 78, serum immunoelectrophoresis showed a questionable faint monoclonal band present in the gamma region of IgG kappa, Lyme disease negative, paraneoplastic antibody found neuronal ACH R ganglionic neuronal antibodies high. Review of Systems: Patient complains of symptoms per HPI as well as the following symptoms ; leg pain and weakness. Pertinent negatives and positives per HPI. All others negative.   Social History   Socioeconomic History  . Marital status: Single    Spouse name: Not on file  . Number of children: 2  . Years of education: Not on file  . Highest education level: Not on file  Occupational History    Comment: retired  Scientific laboratory technician  . Financial resource strain: Not on file  . Food insecurity    Worry: Not on file    Inability: Not on file  . Transportation needs    Medical: Not on file    Non-medical: Not on file  Tobacco  Use  . Smoking status: Former Research scientist (life sciences)  . Smokeless tobacco: Never Used  Substance and Sexual Activity  . Alcohol use: Never    Frequency: Never  . Drug use: Never  . Sexual activity: Not on file  Lifestyle  . Physical activity    Days per week: Not on file    Minutes per session: Not on file  . Stress: Not on file  Relationships  . Social Herbalist on phone: Not on file    Gets together: Not on file    Attends religious service: Not on file    Active member of club or organization: Not on file    Attends meetings of clubs or organizations: Not on file    Relationship status: Not on file  . Intimate partner violence    Fear of current or ex partner: Not on file    Emotionally abused: Not on file    Physically abused: Not on file    Forced sexual activity: Not on file  Other Topics Concern  . Not on file  Social History Narrative   Lives at home with her daughter, working on transitioning to her own place  Right handed    Family History  Problem Relation Age of Onset  . Neuromuscular disorder Neg Hx     Past Medical History:  Diagnosis Date  . Anxiety   . Depression   . Diabetes ([place])   . Headache   . Hyperlipidemia   . Hypertension   . Neuropathy   . Rheumatoid aortitis     Patient Active Problem List   Diagnosis Date Noted  . Polyneuropathy 12/16/2018  . 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 Obesity (BMI 30-39.9) 12/16/2018    Past Surgical History:  Procedure Laterality Date  . APPENDECTOMY    . BRAIN SURGERY    . SHOULDER SURGERY Right     Current Outpatient Medications  Medication Sig Dispense Refill  . Ascorbic Acid (VITAMIN C PO) Take 1,000 mg by mouth daily.    Marland Kitchen b complex vitamins tablet Take 1 tablet by mouth daily.    . calcium carbonate (TUMS - DOSED IN MG ELEMENTAL CALCIUM) 500 MG chewable tablet Chew 1 tablet by mouth daily.    . folic acid (FOLVITE) 1 MG tablet Take 1 mg by mouth daily.     Marland Kitchen lisinopril-hydrochlorothiazide (ZESTORETIC) 20-25 MG tablet Take 1 tablet by mouth daily.    . metFORMIN (GLUCOPHAGE) 500 MG tablet Take 500 mg by mouth daily.     . methotrexate (RHEUMATREX) 2.5 MG tablet Take 2.5 mg by mouth once a week. Caution:Chemotherapy. Protect from light. Take 7 tabs weekly    . UNABLE TO FIND Take 1 tablet by mouth daily. Med Name: [facility]    . atorvastatin (LIPITOR) 20 MG tablet Take 20 mg by mouth daily.    Marland Kitchen lovastatin (MEVACOR) 20 MG tablet Take 20 mg by mouth at bedtime.     No current facility-administered medications for this visit.     Allergies as of 12/16/2018 - Review Complete 12/16/2018  Allergen Reaction Noted  . Latex  09/26/2018  . Other  09/26/2018  . Penicillins  09/26/2018  . Sulfa antibiotics  09/26/2018  . Tetanus toxoids  09/26/2018    Vitals: BP 119/63 (BP Location: Right Arm, Patient Position: Sitting)   Pulse 78   Temp 97.7 F (36.5 C) Comment: taken at front door  Ht 5' 2.5" (1.588 m)   Wt 189 lb (85.7 kg)   BMI 34.02 kg/m  Last Weight:  Wt Readings from Last 1 Encounters:  12/16/18 189 lb (85.7 kg)   Last Height:   Ht Readings from Last 1 Encounters:  12/16/18 5' 2.5" (1.588 m)     Physical exam: no changes Exam: Gen: NAD, conversant, well nourised, obese, well groomed                     CV: RRR, no MRG. No Carotid Bruits. No peripheral edema, warm, nontender Eyes: Conjunctivae clear without exudates or hemorrhage  Neuro: no changes Detailed Neurologic Exam  Speech:    Speech is normal; fluent and spontaneous with normal comprehension.  Cognition:    The patient is oriented to person, place, and time;     recent and remote memory intact;     language fluent;     normal attention, concentration,     fund of knowledge Cranial Nerves:    The pupils are equal, round, and reactive to light. Attempted fundoscopic exam could not visualize due to small pupils.  Visual fields are full to finger  confrontation. Extraocular movements are intact. Trigeminal sensation is intact  and the muscles of mastication are normal. The face is symmetric. The palate elevates in the midline. Hearing intact. Voice is normal. Shoulder shrug is normal. The tongue has normal motion without fasciculations.   Coordination:    No dysmetria  Gait:    Slightly wide based with imbalance, slowed and cautious with cane  Motor Observation:    No asymmetry, no atrophy, and no involuntary movements noted. Tone:    Normal muscle tone.    Posture:    Posture is normal. normal erect    Strength:    Strength is V/V in the upper and lower limbs.      Sensation: intact to LT     Reflex Exam:  DTR's:    Absent AJs otherwise deep tendon reflexes in the upper and lower extremities are normal bilaterally.   Toes:    The toes are downgoing bilaterally.   Clonus:    Clonus is absent.    Assessment/Plan: 78 year old patient with a past medical history of diabetes, hyperlipidemia, hypertension, rheumatoid arthritis on methotrexate who was previously managed by neurology for acute to subacute onset of weakness in the legs, EMG nerve conduction study demonstrated a relatively severe, active and chronic sensorimotor polyneuropathy, and inflammatory/autoimmune/toxic/metabolic and infectious work-up was completed which revealed elevated ESR CRP, positive ANA and weakly positive SPEP IgG likely related to her known rheumatoid arthritis.  Also found to have a mildly elevated a CHR ganglionic neuronal antibody.  Her LP showed elevated protein of 57 but otherwise normal.  She was treated with IVIG.   - Vertigo Better - Neuropathy continues to improve but she is not ready to change from every 4 weeks to every 5 weeks. She is also nervous to change from 5-day infusion. Will hold for now. - Difficult getting lines for IV, since IVIG may be ongoing for a year or longer will order a port - obesity: healthy weight and wellness  center, discussed diet and exercise  Orders Placed This Encounter  Procedures  . IR IMAGING GUIDED PORT INSERTION  . Ambulatory referral to Family Practice     Cc: Caren Macadam, MD,    A total of 25 minutes was spent face-to-face with this patient. Over half this time was spent on counseling patient on the  1. Polyneuropathy   2. Obesity (BMI 30-39.9)   3. Long-term current use of intravenous immunoglobulin (IVIG)    diagnosis and different diagnostic and therapeutic options, counseling and coordination of care, risks ans benefits of management, compliance, or risk factor reduction and education.     Sarina Ill, MD  [facility] [address] [place], [place] [address]  Phone [phone] Fax [phone]

## 2018-12-16 NOTE — Patient Instructions (Signed)
For burning at night" try gabapentin and/or alpha lipoic acid 600mg  a day over the counter  -May consider daily alpha lipoic acid which is an antioxidant that may reduce free radical oxidative stress associated with polyneuropathy, existing evidence suggests that alpha lipoic acid significantly reduces stabbing, lancinating and burning pain and diabetic neuropathy with its onset of action as early as 1-2 weeks.  Gabapentin capsules or tablets What is this medicine? GABAPENTIN (GA ba pen tin) is used to control seizures in certain types of epilepsy. It is also used to treat certain types of nerve pain. This medicine may be used for other purposes; ask your health care provider or pharmacist if you have questions. COMMON BRAND NAME(S): Active-PAC with Gabapentin, Gabarone, Neurontin What should I tell my health care provider before I take this medicine? They need to know if you have any of these conditions:  history of drug abuse or alcohol abuse problem  kidney disease  lung or breathing disease  suicidal thoughts, plans, or attempt; a previous suicide attempt by you or a family member  an unusual or allergic reaction to gabapentin, other medicines, foods, dyes, or preservatives  pregnant or trying to get pregnant  breast-feeding How should I use this medicine? Take this medicine by mouth with a glass of water. Follow the directions on the prescription label. You can take it with or without food. If it upsets your stomach, take it with food. Take your medicine at regular intervals. Do not take it more often than directed. Do not stop taking except on your doctor's advice. If you are directed to break the 600 or 800 mg tablets in half as part of your dose, the extra half tablet should be used for the next dose. If you have not used the extra half tablet within 28 days, it should be thrown away. A special MedGuide will be given to you by the pharmacist with each prescription and refill. Be  sure to read this information carefully each time. Talk to your pediatrician regarding the use of this medicine in children. While this drug may be prescribed for children as young as 3 years for selected conditions, precautions do apply. Overdosage: If you think you have taken too much of this medicine contact a poison control center or emergency room at once. NOTE: This medicine is only for you. Do not share this medicine with others. What if I miss a dose? If you miss a dose, take it as soon as you can. If it is almost time for your next dose, take only that dose. Do not take double or extra doses. What may interact with this medicine? This medicine may interact with the following medications:  alcohol  antihistamines for allergy, cough, and cold  certain medicines for anxiety or sleep  certain medicines for depression like amitriptyline, fluoxetine, sertraline  certain medicines for seizures like phenobarbital, primidone  certain medicines for stomach problems  general anesthetics like halothane, isoflurane, methoxyflurane, propofol  local anesthetics like lidocaine, pramoxine, tetracaine  medicines that relax muscles for surgery  narcotic medicines for pain  phenothiazines like chlorpromazine, mesoridazine, prochlorperazine, thioridazine This list may not describe all possible interactions. Give your health care provider a list of all the medicines, herbs, non-prescription drugs, or dietary supplements you use. Also tell them if you smoke, drink alcohol, or use illegal drugs. Some items may interact with your medicine. What should I watch for while using this medicine? Visit your doctor or health care provider for regular checks on  your progress. You may want to keep a record at home of how you feel your condition is responding to treatment. You may want to share this information with your doctor or health care provider at each visit. You should contact your doctor or health care  provider if your seizures get worse or if you have any new types of seizures. Do not stop taking this medicine or any of your seizure medicines unless instructed by your doctor or health care provider. Stopping your medicine suddenly can increase your seizures or their severity. This medicine may cause serious skin reactions. They can happen weeks to months after starting the medicine. Contact your health care provider right away if you notice fevers or flu-like symptoms with a rash. The rash may be red or purple and then turn into blisters or peeling of the skin. Or, you might notice a red rash with swelling of the face, lips or lymph nodes in your neck or under your arms. Wear a medical identification bracelet or chain if you are taking this medicine for seizures, and carry a card that lists all your medications. You may get drowsy, dizzy, or have blurred vision. Do not drive, use machinery, or do anything that needs mental alertness until you know how this medicine affects you. To reduce dizzy or fainting spells, do not sit or stand up quickly, especially if you are an older patient. Alcohol can increase drowsiness and dizziness. Avoid alcoholic drinks. Your mouth may get dry. Chewing sugarless gum or sucking hard candy, and drinking plenty of water will help. The use of this medicine may increase the chance of suicidal thoughts or actions. Pay special attention to how you are responding while on this medicine. Any worsening of mood, or thoughts of suicide or dying should be reported to your health care provider right away. Women who become pregnant while using this medicine may enroll in the [place] Antiepileptic Drug Pregnancy Registry by calling [id number]. This registry collects information about the safety of antiepileptic drug use during pregnancy. What side effects may I notice from receiving this medicine? Side effects that you should report to your doctor or health care professional as  soon as possible:  allergic reactions like skin rash, itching or hives, swelling of the face, lips, or tongue  breathing problems  rash, fever, and swollen lymph nodes  redness, blistering, peeling or loosening of the skin, including inside the mouth  suicidal thoughts, mood changes Side effects that usually do not require medical attention (report to your doctor or health care professional if they continue or are bothersome):  dizziness  drowsiness  headache  nausea, vomiting  swelling of ankles, feet, hands  tiredness This list may not describe all possible side effects. Call your doctor for medical advice about side effects. You may report side effects to FDA at 1-800-FDA-1088. Where should I keep my medicine? Keep out of reach of children. This medicine may cause accidental overdose and death if it taken by other adults, children, or pets. Mix any unused medicine with a substance like cat litter or coffee grounds. Then throw the medicine away in a sealed container like a sealed bag or a coffee can with a lid. Do not use the medicine after the expiration date. Store at room temperature between 15 and 30 degrees C (59 and 86 degrees F). NOTE: This sheet is a summary. It may not cover all possible information. If you have questions about this medicine, talk to your doctor, pharmacist, or  health care provider.  2020 Elsevier/Gold Standard (2018-05-24 14:16:43)

## 2018-12-25 ENCOUNTER — Other Ambulatory Visit: Payer: Self-pay

## 2018-12-25 ENCOUNTER — Encounter: Payer: Self-pay | Admitting: Podiatry

## 2018-12-25 ENCOUNTER — Ambulatory Visit: Payer: Medicare Other | Admitting: Podiatry

## 2018-12-25 VITALS — Resp 16

## 2018-12-25 DIAGNOSIS — L600 Ingrowing nail: Secondary | ICD-10-CM | POA: Diagnosis not present

## 2018-12-25 NOTE — Progress Notes (Signed)
   Subjective:    Patient ID: Sarah Whitaker, female    DOB: [birth year], 78 y.o.   MRN: [id number]  HPI    Review of Systems  All other systems reviewed and are negative.      Objective:   Physical Exam        Assessment & Plan:

## 2018-12-25 NOTE — Patient Instructions (Signed)

## 2018-12-26 NOTE — Progress Notes (Signed)
Subjective:   Patient ID: Sarah Whitaker, female   DOB: 78 y.o.   MRN: [id number]   HPI Patient presents with severe thick damaged big toenail left that is been present for a long time and is become worse over the last couple years.  Patient cannot trim it and is interested in a long-term solution and has diabetes under excellent control with a normal A1c and does not smoke and likes to be active   Review of Systems  All other systems reviewed and are negative.       Objective:  Physical Exam Vitals signs and nursing note reviewed.  Constitutional:      Appearance: She is well-developed.  Pulmonary:     Effort: Pulmonary effort is normal.  Musculoskeletal: Normal range of motion.  Skin:    General: Skin is warm.  Neurological:     Mental Status: She is alert.     Neurovascular status was found to be intact muscle strength was found to be adequate range of motion within normal limits.  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  I explained procedure risk and patient wants surgery and I allowed her to go over consent form going over all possible complications associated with nail removal of a permanent fashion.  She is willing to accept risk signed consent form and today I anesthetized the left big toe 60 mg like Marcaine mixture sterile prep applied to the toe using sterile instrumentation the hallux nail was removed the matrix exposed and phenol 5 applications 30 seconds were applied followed by alcohol lavage.  I then went ahead applied sterile dressing gave instructions for soaks and to leave dressing on 24 hours but take it off earlier if any throbbing were to occur and also  wrote prescription for drops and encouraged her to call with any questions concerns she may have signed this

## 2018-12-31 ENCOUNTER — Other Ambulatory Visit: Payer: Self-pay | Admitting: Radiology

## 2019-01-01 ENCOUNTER — Ambulatory Visit: Payer: Medicare Other | Admitting: Neurology

## 2019-01-01 ENCOUNTER — Encounter (HOSPITAL_COMMUNITY): Payer: Self-pay

## 2019-01-01 ENCOUNTER — Other Ambulatory Visit: Payer: Self-pay | Admitting: Neurology

## 2019-01-01 ENCOUNTER — Other Ambulatory Visit: Payer: Self-pay

## 2019-01-01 ENCOUNTER — Ambulatory Visit (HOSPITAL_COMMUNITY)
Admission: RE | Admit: 2019-01-01 | Discharge: 2019-01-01 | Disposition: A | Discharge status: Home / Self Care | Payer: Medicare Other | Source: Ambulatory Visit | Attending: Neurology | Admitting: Neurology

## 2019-01-01 DIAGNOSIS — I10 Essential (primary) hypertension: Secondary | ICD-10-CM | POA: Insufficient documentation

## 2019-01-01 DIAGNOSIS — E785 Hyperlipidemia, unspecified: Secondary | ICD-10-CM | POA: Diagnosis not present

## 2019-01-01 DIAGNOSIS — Z880 Allergy status to penicillin: Secondary | ICD-10-CM | POA: Insufficient documentation

## 2019-01-01 DIAGNOSIS — Z882 Allergy status to sulfonamides status: Secondary | ICD-10-CM | POA: Insufficient documentation

## 2019-01-01 DIAGNOSIS — R42 Dizziness and giddiness: Secondary | ICD-10-CM | POA: Insufficient documentation

## 2019-01-01 DIAGNOSIS — M069 Rheumatoid arthritis, unspecified: Secondary | ICD-10-CM | POA: Insufficient documentation

## 2019-01-01 DIAGNOSIS — Z79899 Other long term (current) drug therapy: Secondary | ICD-10-CM | POA: Diagnosis not present

## 2019-01-01 DIAGNOSIS — Z87891 Personal history of nicotine dependence: Secondary | ICD-10-CM | POA: Insufficient documentation

## 2019-01-01 DIAGNOSIS — E1140 Type 2 diabetes mellitus with diabetic neuropathy, unspecified: Secondary | ICD-10-CM | POA: Diagnosis not present

## 2019-01-01 DIAGNOSIS — Z7984 Long term (current) use of oral hypoglycemic drugs: Secondary | ICD-10-CM | POA: Insufficient documentation

## 2019-01-01 HISTORY — PX: IR IMAGING GUIDED PORT INSERTION: IMG5740

## 2019-01-01 LAB — CBC
HCT: 34.9 % — ABNORMAL LOW (ref 36.0–46.0)
Hemoglobin: 11.3 g/dL — ABNORMAL LOW (ref 12.0–15.0)
MCH: 33.7 pg (ref 26.0–34.0)
MCHC: 32.4 g/dL (ref 30.0–36.0)
MCV: 104.2 fL — ABNORMAL HIGH (ref 80.0–100.0)
Platelets: 271 10*3/uL (ref 150–400)
RBC: 3.35 MIL/uL — ABNORMAL LOW (ref 3.87–5.11)
RDW: 19.3 % — ABNORMAL HIGH (ref 11.5–15.5)
WBC: 3.6 10*3/uL — ABNORMAL LOW (ref 4.0–10.5)
nRBC: 0 % (ref 0.0–0.2)

## 2019-01-01 LAB — BASIC METABOLIC PANEL
Anion gap: 10 (ref 5–15)
BUN: 12 mg/dL (ref 8–23)
CO2: 23 mmol/L (ref 22–32)
Calcium: 9.3 mg/dL (ref 8.9–10.3)
Chloride: 103 mmol/L (ref 98–111)
Creatinine, Ser: 0.76 mg/dL (ref 0.44–1.00)
GFR calc Af Amer: >60 mL/min (ref >60)
GFR calc non Af Amer: >60 mL/min (ref >60)
Glucose, Bld: 101 mg/dL — ABNORMAL HIGH (ref 70–99)
Potassium: 4.2 mmol/L (ref 3.5–5.1)
Sodium: 136 mmol/L (ref 135–145)

## 2019-01-01 LAB — GLUCOSE, CAPILLARY: Glucose-Capillary: 93 mg/dL (ref 70–99)

## 2019-01-01 LAB — PROTIME-INR
INR: 1 (ref 0.8–1.2)
Prothrombin Time: 13.4 seconds (ref 11.4–15.2)

## 2019-01-01 MED ORDER — HEPARIN SOD (PORK) LOCK FLUSH 100 UNIT/ML IV SOLN
INTRAVENOUS | Status: AC | PRN
  Administered 2019-01-01 (×4): 500 [IU] via INTRAVENOUS

## 2019-01-01 MED ORDER — HEPARIN SOD (PORK) LOCK FLUSH 100 UNIT/ML IV SOLN
INTRAVENOUS | Status: AC
  Filled 2019-01-01: qty 5

## 2019-01-01 MED ORDER — LIDOCAINE-EPINEPHRINE (PF) 1 %-1:200000 IJ SOLN
INTRAMUSCULAR | Status: AC
  Filled 2019-01-01: qty 30

## 2019-01-01 MED ORDER — LIDOCAINE HCL 1 % IJ SOLN
INTRAMUSCULAR | Status: AC
  Filled 2019-01-01: qty 20

## 2019-01-01 MED ORDER — FENTANYL CITRATE (PF) 100 MCG/2ML IJ SOLN
INTRAMUSCULAR | Status: AC | PRN
  Administered 2019-01-01: 50 ug via INTRAVENOUS

## 2019-01-01 MED ORDER — VANCOMYCIN HCL IN DEXTROSE 1-5 GM/200ML-% IV SOLN
INTRAVENOUS | Status: AC
  Administered 2019-01-01: 09:00:00 1000 mg via INTRAVENOUS
  Filled 2019-01-01: qty 200

## 2019-01-01 MED ORDER — SODIUM CHLORIDE 0.9 % IV SOLN: INTRAVENOUS | Status: DC

## 2019-01-01 MED ORDER — FENTANYL CITRATE (PF) 100 MCG/2ML IJ SOLN
INTRAMUSCULAR | Status: AC
  Filled 2019-01-01: qty 2

## 2019-01-01 MED ORDER — LIDOCAINE HCL 1 % IJ SOLN
INTRAMUSCULAR | Status: AC | PRN
  Administered 2019-01-01: 10 mL

## 2019-01-01 MED ORDER — VANCOMYCIN HCL IN DEXTROSE 1-5 GM/200ML-% IV SOLN
1000.0000 mg | INTRAVENOUS | Status: AC
  Administered 2019-01-01: 09:00:00 1000 mg via INTRAVENOUS

## 2019-01-01 MED ORDER — MIDAZOLAM HCL 2 MG/2ML IJ SOLN
INTRAMUSCULAR | Status: AC
  Filled 2019-01-01: qty 2

## 2019-01-01 MED ORDER — LIDOCAINE-EPINEPHRINE (PF) 1 %-1:200000 IJ SOLN
INTRAMUSCULAR | Status: AC | PRN
  Administered 2019-01-01: 20 mL

## 2019-01-01 MED ORDER — MIDAZOLAM HCL 2 MG/2ML IJ SOLN
INTRAMUSCULAR | Status: AC | PRN
  Administered 2019-01-01: 1 mg via INTRAVENOUS

## 2019-01-01 NOTE — Procedures (Signed)
Interventional Radiology Procedure:   Indications: Poor venous access and requires immunoglobulin infusions for neuropathy.  Procedure: Port placement  Findings: Right jugular port, tip at SVC/RA junction  Complications: None     EBL: Minimal, less than 10 ml  Plan: Discharge in one hour.  Keep port site and incisions dry for at least 24 hours.     [name] R. Anselm Pancoast, MD  Pager: [phone]

## 2019-01-01 NOTE — H&P (Signed)
Chief Complaint: Patient was seen in consultation today for [facility] a cath placement at the request of Lamont B  Referring Physician(s): Melvenia Beam  Supervising Physician: Markus Daft  Patient Status: [place] - Out-pt  History of Present Illness: Sarah Whitaker is a 78 y.o. female   Neuropathy; vertigo Using IVIG per Dr Jaynee Eagles Difficult stick MD requesting PAC placement for continued treatment   Past Medical History:  Diagnosis Date  . Anxiety   . Depression   . Diabetes ([place])   . Headache   . Hyperlipidemia   . Hypertension   . Neuropathy   . Rheumatoid aortitis     Past Surgical History:  Procedure Laterality Date  . APPENDECTOMY    . BRAIN SURGERY    . SHOULDER SURGERY Right     Allergies: Penicillins, Red dye, Latex, Sulfa antibiotics, and Tetanus toxoids  Medications: Prior to Admission medications   Medication Sig Start Date End Date Taking? Authorizing Provider  Ascorbic Acid (VITAMIN C PO) Take 1,000 mg by mouth daily.   Yes [provider]  atorvastatin (LIPITOR) 20 MG tablet Take 20 mg by mouth daily.   Yes [provider]  b complex vitamins tablet Take 2 tablets by mouth daily.    Yes [provider]  calcium carbonate (TUMS - DOSED IN MG ELEMENTAL CALCIUM) 500 MG chewable tablet Chew 1 tablet by mouth daily.   Yes [provider]  folic acid (FOLVITE) 1 MG tablet Take 1 mg by mouth daily.   Yes [provider]  Immune Globulin, Human, (GAMMAGARD IV) Inject into the vein See admin instructions. Mon - Fri   Yes [provider]  lisinopril-hydrochlorothiazide (ZESTORETIC) 20-25 MG tablet Take 1 tablet by mouth daily.   Yes [provider]  metFORMIN (GLUCOPHAGE) 500 MG tablet Take 500 mg by mouth daily with breakfast.    Yes [provider]  methotrexate (RHEUMATREX) 2.5 MG tablet Take 17.5 mg by mouth once a week. Caution:Chemotherapy. Protect from light. Take 7 tabs weekly;  Mondays   Yes [provider]     Family History  Problem Relation Age of Onset  . Neuromuscular disorder Neg Hx     Social History   Socioeconomic History  . Marital status: Single    Spouse name: Not on file  . Number of children: 2  . Years of education: Not on file  . Highest education level: Not on file  Occupational History    Comment: retired  Scientific laboratory technician  . Financial resource strain: Not on file  . Food insecurity    Worry: Not on file    Inability: Not on file  . Transportation needs    Medical: Not on file    Non-medical: Not on file  Tobacco Use  . Smoking status: Former Research scientist (life sciences)  . Smokeless tobacco: Never Used  Substance and Sexual Activity  . Alcohol use: Never    Frequency: Never  . Drug use: Never  . Sexual activity: Not on file  Lifestyle  . Physical activity    Days per week: Not on file    Minutes per session: Not on file  . Stress: Not on file  Relationships  . Social Herbalist on phone: Not on file    Gets together: Not on file    Attends religious service: Not on file    Active member of club or organization: Not on file    Attends meetings of clubs or organizations: Not on  file    Relationship status: Not on file  Other Topics Concern  . Not on file  Social History Narrative   Lives at home with her daughter, working on transitioning to her own place   Right handed    Review of Systems: A 12 point ROS discussed and pertinent positives are indicated in the HPI above.  All other systems are negative.  Review of Systems  Constitutional: Positive for fatigue. Negative for activity change.  Respiratory: Negative for cough and shortness of breath.   Cardiovascular: Negative for chest pain.  Gastrointestinal: Negative for abdominal distention.  Psychiatric/Behavioral: Negative for behavioral problems and confusion.    Vital Signs: BP 127/65   Pulse 72   Temp (!) 97.4 F (36.3 C) (Skin)   Resp 16   Ht 5' 2.5"  (1.588 m)   Wt 189 lb (85.7 kg)   SpO2 100%   BMI 34.02 kg/m   Physical Exam Vitals signs reviewed.  Cardiovascular:     Rate and Rhythm: Normal rate and regular rhythm.     Heart sounds: Normal heart sounds.  Pulmonary:     Effort: Pulmonary effort is normal.     Breath sounds: Normal breath sounds.  Abdominal:     Palpations: Abdomen is soft.  Musculoskeletal: Normal range of motion.  Skin:    General: Skin is dry.  Neurological:     Mental Status: She is alert and oriented to person, place, and time.  Psychiatric:        Mood and Affect: Mood normal.        Behavior: Behavior normal.        Thought Content: Thought content normal.        Judgment: Judgment normal.     Imaging: No results found.  Labs:  CBC: Recent Labs    09/30/18 1519 01/01/19 0706  WBC 3.4 3.6*  HGB 10.0* 11.3*  HCT 29.0* 34.9*  PLT 269 271    COAGS: No results for input(s): INR, APTT in the last 8760 hours.  BMP: Recent Labs    09/30/18 1519  NA 135  K 4.1  CL 96  CO2 20  GLUCOSE 67  BUN 12  CALCIUM 9.4  CREATININE 0.72  GFRNONAA 82  GFRAA 94    LIVER FUNCTION TESTS: Recent Labs    09/30/18 1519  BILITOT 0.7  AST 41*  ALT 30  ALKPHOS 94  PROT 7.9  ALBUMIN 3.9    TUMOR MARKERS: No results for input(s): AFPTM, CEA, CA199, CHROMGRNA in the last 8760 hours.  Assessment and Plan:  Neuropathy Follows with Dr Lucia Gaskins IVIG x 5 days every 4-5 weeks Difficult venous access Scheduled for Port a cath placement Risks and benefits of image guided port-a-catheter placement was discussed with the patient including, but not limited to bleeding, infection, pneumothorax, or fibrin sheath development and need for additional procedures.  All of the patient's questions were answered, patient is agreeable to proceed. Consent signed and in chart.   Thank you for this interesting consult.  I greatly enjoyed meeting Gissela Krogh and look forward to participating in their care.  A  copy of this report was sent to the requesting provider on this date.  Electronically Signed: Robet Leu, PA-C 01/01/2019, 7:58 AM   I spent a total of  30 Minutes   in face to face in clinical consultation, greater than 50% of which was counseling/coordinating care for [facility] placement

## 2019-01-01 NOTE — Discharge Instructions (Signed)
Moderate Conscious Sedation, Adult, Care After °These instructions provide you with information about caring for yourself after your procedure. Your health care provider may also give you more specific instructions. Your treatment has been planned according to current medical practices, but problems sometimes occur. Call your health care provider if you have any problems or questions after your procedure. °What can I expect after the procedure? °After your procedure, it is common: °· To feel sleepy for several hours. °· To feel clumsy and have poor balance for several hours. °· To have poor judgment for several hours. °· To vomit if you eat too soon. °Follow these instructions at home: °For at least 24 hours after the procedure: ° °· Do not: °? Participate in activities where you could fall or become injured. °? Drive. °? Use heavy machinery. °? Drink alcohol. °? Take sleeping pills or medicines that cause drowsiness. °? Make important decisions or sign legal documents. °? Take care of children on your own. °· Rest. °Eating and drinking °· Follow the diet recommended by your health care provider. °· If you vomit: °? Drink water, juice, or soup when you can drink without vomiting. °? Make sure you have little or no nausea before eating solid foods. °General instructions °· Have a responsible adult stay with you until you are awake and alert. °· Take over-the-counter and prescription medicines only as told by your health care provider. °· If you smoke, do not smoke without supervision. °· Keep all follow-up visits as told by your health care provider. This is important. °Contact a health care provider if: °· You keep feeling nauseous or you keep vomiting. °· You feel light-headed. °· You develop a rash. °· You have a fever. °Get help right away if: °· You have trouble breathing. °This information is not intended to replace advice given to you by your health care provider. Make sure you discuss any questions you have  with your health care provider. °Document Released: 12/11/2012 Document Revised: 02/02/2017 Document Reviewed: 06/12/2015 °Elsevier Patient Education © 2020 Elsevier Inc. °Implanted Port Home Guide °An implanted port is a device that is placed under the skin. It is usually placed in the chest. The device can be used to give IV medicine, to take blood, or for dialysis. You may have an implanted port if: °· You need IV medicine that would be irritating to the small veins in your hands or arms. °· You need IV medicines, such as antibiotics, for a long period of time. °· You need IV nutrition for a long period of time. °· You need dialysis. °Having a port means that your health care provider will not need to use the veins in your arms for these procedures. You may have fewer limitations when using a port than you would if you used other types of long-term IVs, and you will likely be able to return to normal activities after your incision heals. °An implanted port has two main parts: °· Reservoir. The reservoir is the part where a needle is inserted to give medicines or draw blood. The reservoir is round. After it is placed, it appears as a small, raised area under your skin. °· Catheter. The catheter is a thin, flexible tube that connects the reservoir to a vein. Medicine that is inserted into the reservoir goes into the catheter and then into the vein. °How is my port accessed? °To access your port: °· A numbing cream may be placed on the skin over the port site. °· Your   health care provider will put on a mask and sterile gloves. °· The skin over your port will be cleaned carefully with a germ-killing soap and allowed to dry. °· Your health care provider will gently pinch the port and insert a needle into it. °· Your health care provider will check for a blood return to make sure the port is in the vein and is not clogged. °· If your port needs to remain accessed to get medicine continuously (constant infusion), your  health care provider will place a clear bandage (dressing) over the needle site. The dressing and needle will need to be changed every week, or as told by your health care provider. °What is flushing? °Flushing helps keep the port from getting clogged. Follow instructions from your health care provider about how and when to flush the port. Ports are usually flushed with saline solution or a medicine called heparin. The need for flushing will depend on how the port is used: °· If the port is only used from time to time to give medicines or draw blood, the port may need to be flushed: °? Before and after medicines have been given. °? Before and after blood has been drawn. °? As part of routine maintenance. Flushing may be recommended every 4-6 weeks. °· If a constant infusion is running, the port may not need to be flushed. °· Throw away any syringes in a disposal container that is meant for sharp items (sharps container). You can buy a sharps container from a pharmacy, or you can make one by using an empty hard plastic bottle with a cover. °How long will my port stay implanted? °The port can stay in for as long as your health care provider thinks it is needed. When it is time for the port to come out, a surgery will be done to remove it. The surgery will be similar to the procedure that was done to put the port in. °Follow these instructions at home: ° °· Flush your port as told by your health care provider. °· If you need an infusion over several days, follow instructions from your health care provider about how to take care of your port site. Make sure you: °? Wash your hands with soap and water before you change your dressing. If soap and water are not available, use alcohol-based hand sanitizer. °? Change your dressing as told by your health care provider. °? Place any used dressings or infusion bags into a plastic bag. Throw that bag in the trash. °? Keep the dressing that covers the needle clean and dry. Do not  get it wet. °? Do not use scissors or sharp objects near the tube. °? Keep the tube clamped, unless it is being used. °· Check your port site every day for signs of infection. Check for: °? Redness, swelling, or pain. °? Fluid or blood. °? Pus or a bad smell. °· Protect the skin around the port site. °? Avoid wearing bra straps that rub or irritate the site. °? Protect the skin around your port from seat belts. Place a soft pad over your chest if needed. °· Bathe or shower as told by your health care provider. The site may get wet as long as you are not actively receiving an infusion. °· Return to your normal activities as told by your health care provider. Ask your health care provider what activities are safe for you. °· Carry a medical alert card or wear a medical alert bracelet at all   times. This will let health care providers know that you have an implanted port in case of an emergency. °Get help right away if: °· You have redness, swelling, or pain at the port site. °· You have fluid or blood coming from your port site. °· You have pus or a bad smell coming from the port site. °· You have a fever. °Summary °· Implanted ports are usually placed in the chest for long-term IV access. °· Follow instructions from your health care provider about flushing the port and changing bandages (dressings). °· Take care of the area around your port by avoiding clothing that puts pressure on the area, and by watching for signs of infection. °· Protect the skin around your port from seat belts. Place a soft pad over your chest if needed. °· Get help right away if you have a fever or you have redness, swelling, pain, drainage, or a bad smell at the port site. °This information is not intended to replace advice given to you by your health care provider. Make sure you discuss any questions you have with your health care provider. °Document Released: 02/20/2005 Document Revised: 06/14/2018 Document Reviewed: 03/25/2016 °Elsevier  Patient Education © 2020 Elsevier Inc. ° °

## 2019-01-02 ENCOUNTER — Encounter (HOSPITAL_COMMUNITY): Payer: Self-pay

## 2019-01-06 ENCOUNTER — Other Ambulatory Visit: Payer: Self-pay | Admitting: Neurology

## 2019-01-06 ENCOUNTER — Telehealth: Payer: Self-pay | Admitting: Neurology

## 2019-01-06 MED ORDER — LIDOCAINE-PRILOCAINE 2.5-2.5 % EX CREA: 1.0000 "application " | TOPICAL_CREAM | CUTANEOUS | 6 refills | Status: DC | PRN

## 2019-01-06 NOTE — Telephone Encounter (Signed)
Pt is calling in wanting to know if she can get a script of Lidocainee sent in to her [place], [place] AT [address]

## 2019-01-06 NOTE — Telephone Encounter (Signed)
Done. thanks

## 2019-01-06 NOTE — Telephone Encounter (Signed)
Called pt & LVM (ok per DPR) advising Dr. Jaynee Eagles sent Lidocaine-Prilocaine (EMLA) cream to [facility] on [address]. I advised of instructions to apply once topically as needed to port site. Advised not to put the cream directly on the incision. Asked for a call back if she has any questions. Left office number in message.

## 2019-01-06 NOTE — Telephone Encounter (Signed)
Spoke with pt. Her port was placed last Wednesday. Pt stated her nurse told her it was ok to start applying the lidocaine.This afternoon is her first access for infusion. She would like to see if lidocaine cream can be prescribed. She will have infusions every day this week. Pt stated she has no known allergies to lidocaine or other "caine" numbing agents.  Pt understands I will consult with Dr Jaynee Eagles. She verbalized appreciation for the call.

## 2019-01-14 ENCOUNTER — Telehealth: Payer: Self-pay | Admitting: Podiatry

## 2019-01-14 NOTE — Telephone Encounter (Signed)
Pt was seen in office on 10/21 and had a toenail removed and wanted to speak with the nurse about how it is healing.

## 2019-01-14 NOTE — Telephone Encounter (Signed)
I spoke with pt and initially had difficulty understanding pt. I had pt repeat the status of her toenail procedure. Pt states there is still some clear and reddish drainage, still doing the epsom salt soak and antibiotic ointment. I told pt to perform epsom salt soaks and when up and around or in shoes have on the antibiotic ointment bandaid, and allow to air dry at night and continue until 01/20/2019 and call with status or sooner if needed. Pt states understanding.

## 2019-01-29 NOTE — Telephone Encounter (Signed)
Received request for most recent ov to be faxed to [facility]. I faxed the office note from 12/16/2018 to them. Received a receipt of confirmation.

## 2019-02-11 ENCOUNTER — Other Ambulatory Visit: Payer: Self-pay | Admitting: Family Medicine

## 2019-02-11 ENCOUNTER — Ambulatory Visit
Admission: RE | Admit: 2019-02-11 | Discharge: 2019-02-11 | Disposition: A | Discharge status: Home / Self Care | Payer: Medicare Other | Source: Ambulatory Visit | Attending: Family Medicine | Admitting: Family Medicine

## 2019-02-11 DIAGNOSIS — R0602 Shortness of breath: Secondary | ICD-10-CM

## 2019-02-14 ENCOUNTER — Telehealth: Payer: Self-pay | Admitting: Neurology

## 2019-02-14 NOTE — Telephone Encounter (Signed)
error 

## 2019-02-22 ENCOUNTER — Encounter (HOSPITAL_COMMUNITY): Payer: Self-pay | Admitting: Emergency Medicine

## 2019-02-22 ENCOUNTER — Emergency Department (HOSPITAL_COMMUNITY)
Admission: EM | Admit: 2019-02-22 | Discharge: 2019-02-22 | Disposition: A | Discharge status: Home / Self Care | Payer: Medicare Other | Attending: Emergency Medicine | Admitting: Emergency Medicine

## 2019-02-22 ENCOUNTER — Emergency Department (HOSPITAL_COMMUNITY): Payer: Medicare Other

## 2019-02-22 ENCOUNTER — Other Ambulatory Visit: Payer: Self-pay

## 2019-02-22 DIAGNOSIS — Z7984 Long term (current) use of oral hypoglycemic drugs: Secondary | ICD-10-CM | POA: Insufficient documentation

## 2019-02-22 DIAGNOSIS — Z79899 Other long term (current) drug therapy: Secondary | ICD-10-CM | POA: Insufficient documentation

## 2019-02-22 DIAGNOSIS — Z87891 Personal history of nicotine dependence: Secondary | ICD-10-CM | POA: Insufficient documentation

## 2019-02-22 DIAGNOSIS — R0600 Dyspnea, unspecified: Secondary | ICD-10-CM | POA: Insufficient documentation

## 2019-02-22 DIAGNOSIS — R222 Localized swelling, mass and lump, trunk: Secondary | ICD-10-CM | POA: Diagnosis not present

## 2019-02-22 DIAGNOSIS — J9859 Other diseases of mediastinum, not elsewhere classified: Secondary | ICD-10-CM | POA: Insufficient documentation

## 2019-02-22 DIAGNOSIS — R531 Weakness: Secondary | ICD-10-CM | POA: Diagnosis not present

## 2019-02-22 DIAGNOSIS — R42 Dizziness and giddiness: Secondary | ICD-10-CM | POA: Insufficient documentation

## 2019-02-22 DIAGNOSIS — I10 Essential (primary) hypertension: Secondary | ICD-10-CM | POA: Diagnosis not present

## 2019-02-22 DIAGNOSIS — Z20828 Contact with and (suspected) exposure to other viral communicable diseases: Secondary | ICD-10-CM | POA: Diagnosis not present

## 2019-02-22 DIAGNOSIS — E119 Type 2 diabetes mellitus without complications: Secondary | ICD-10-CM | POA: Diagnosis not present

## 2019-02-22 DIAGNOSIS — Z91040 Latex allergy status: Secondary | ICD-10-CM | POA: Insufficient documentation

## 2019-02-22 LAB — COMPREHENSIVE METABOLIC PANEL
ALT: 32 U/L (ref 0–44)
AST: 86 U/L — ABNORMAL HIGH (ref 15–41)
Albumin: 3.1 g/dL — ABNORMAL LOW (ref 3.5–5.0)
Alkaline Phosphatase: 81 U/L (ref 38–126)
Anion gap: 9 (ref 5–15)
BUN: 15 mg/dL (ref 8–23)
CO2: 24 mmol/L (ref 22–32)
Calcium: 9.2 mg/dL (ref 8.9–10.3)
Chloride: 103 mmol/L (ref 98–111)
Creatinine, Ser: 0.77 mg/dL (ref 0.44–1.00)
GFR calc Af Amer: >60 mL/min (ref >60)
GFR calc non Af Amer: >60 mL/min (ref >60)
Glucose, Bld: 83 mg/dL (ref 70–99)
Potassium: 4.8 mmol/L (ref 3.5–5.1)
Sodium: 136 mmol/L (ref 135–145)
Total Bilirubin: 0.7 mg/dL (ref 0.3–1.2)
Total Protein: 7.9 g/dL (ref 6.5–8.1)

## 2019-02-22 LAB — CBC
HCT: 35.2 % — ABNORMAL LOW (ref 36.0–46.0)
Hemoglobin: 11.7 g/dL — ABNORMAL LOW (ref 12.0–15.0)
MCH: 33.5 pg (ref 26.0–34.0)
MCHC: 33.2 g/dL (ref 30.0–36.0)
MCV: 100.9 fL — ABNORMAL HIGH (ref 80.0–100.0)
Platelets: 363 10*3/uL (ref 150–400)
RBC: 3.49 MIL/uL — ABNORMAL LOW (ref 3.87–5.11)
RDW: 18.2 % — ABNORMAL HIGH (ref 11.5–15.5)
WBC: 4.4 10*3/uL (ref 4.0–10.5)
nRBC: 0 % (ref 0.0–0.2)

## 2019-02-22 LAB — SARS CORONAVIRUS 2 (TAT 6-24 HRS): SARS Coronavirus 2: NEGATIVE

## 2019-02-22 LAB — D-DIMER, QUANTITATIVE: D-Dimer, Quant: 3.76 ug/mL-FEU — ABNORMAL HIGH (ref 0.00–0.50)

## 2019-02-22 LAB — CBG MONITORING, ED: Glucose-Capillary: 87 mg/dL (ref 70–99)

## 2019-02-22 MED ORDER — IOHEXOL 350 MG/ML SOLN
100.0000 mL | Freq: Once | INTRAVENOUS | Status: AC | PRN
  Administered 2019-02-22: 15:00:00 67 mL via INTRAVENOUS

## 2019-02-22 NOTE — ED Notes (Signed)
Pt returned from CT, portable xray at bedside

## 2019-02-22 NOTE — ED Notes (Signed)
Update given to daughter.  

## 2019-02-22 NOTE — ED Notes (Signed)
Update given to pts daughter, nicole. Pt verbalized consent

## 2019-02-22 NOTE — ED Notes (Signed)
ED Provider at bedside. 

## 2019-02-22 NOTE — ED Notes (Signed)
Patient transported to CT 

## 2019-02-22 NOTE — ED Triage Notes (Signed)
Pt reports an episode of dizziness that occurred as soon as she woke up this morning and put her feet on the ground- she woke up at 8am and went to bed at 1030pm this only lasted only seconds and went away- pt then reports an 1 hr ago and had another episode of dizziness that has seen resolved. Pt states she has also been having sob for 1 month and her pcp has ordered her a echo for this coming week.

## 2019-02-22 NOTE — ED Notes (Signed)
Pt in CT.

## 2019-02-22 NOTE — ED Provider Notes (Signed)
[facility] EMERGENCY DEPARTMENT Provider Note   CSN: [id number] Arrival date & time: 02/22/19  1211     History Chief Complaint  Patient presents with  . Dizziness    Sarah Whitaker is a 78 y.o. female.  HPI 78 year old female history of rheumatoid arthritis, peripheral neuropathy, hypertension, hyperlipidemia, type 2 diabetes who was recently been taken off of her diabetic medications, anxiety depression who presents today complaining of generalized weakness.  She states that she has been being evaluated for the past month for shortness of breath on exertion.  She has been seen by her primary care doctor.  She reports having a chest x-Rahma Meller and a Covid test last Monday that was negative.  She is scheduled for an outpatient echocardiogram.  Today she felt generally weak since getting up with no lateralized deficits.  She had 2 episodes of feeling like everything was spinning that lasted seconds.  She has not had that in the past.  She denies any headache, vision changes, cough, chest pain, nausea, vomiting, or diarrhea.  She has been taking her medications as prescribed.  Denies history of DVT, PE, immobilization, or anticoagulation     Past Medical History:  Diagnosis Date  . Anxiety   . Depression   . Diabetes ([place])   . Headache   . Hyperlipidemia   . Hypertension   . Neuropathy   . Rheumatoid aortitis     Patient Active Problem List   Diagnosis Date Noted  . Polyneuropathy 12/16/2018  . 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 Obesity (BMI 30-39.9) 12/16/2018    Past Surgical History:  Procedure Laterality Date  . APPENDECTOMY    . BRAIN SURGERY    . IR IMAGING GUIDED PORT INSERTION  01/01/2019  . SHOULDER SURGERY Right      OB History   No obstetric history on file.     Family History  Problem Relation Age of Onset  . Neuromuscular disorder Neg Hx     Social History   Tobacco Use  . Smoking status: Former  Research scientist (life sciences)  . Smokeless tobacco: Never Used  Substance Use Topics  . Alcohol use: Never  . Drug use: Never    Home Medications Prior to Admission medications   Medication Sig Start Date End Date Taking? Authorizing Provider  Ascorbic Acid (VITAMIN C PO) Take 1,000 mg by mouth daily.    [provider]  atorvastatin (LIPITOR) 20 MG tablet Take 20 mg by mouth daily.    [provider]  b complex vitamins tablet Take 2 tablets by mouth daily.     [provider]  calcium carbonate (TUMS - DOSED IN MG ELEMENTAL CALCIUM) 500 MG chewable tablet Chew 1 tablet by mouth daily.    [provider]  folic acid (FOLVITE) 1 MG tablet Take 1 mg by mouth daily.    [provider]  Immune Globulin, Human, (GAMMAGARD IV) Inject into the vein See admin instructions. Mon - Fri    [provider]  lidocaine-prilocaine (EMLA) cream Apply 1 application topically as needed. 01/06/19   Melvenia Beam, MD  lisinopril-hydrochlorothiazide (ZESTORETIC) 20-25 MG tablet Take 1 tablet by mouth daily.    [provider]  metFORMIN (GLUCOPHAGE) 500 MG tablet Take 500 mg by mouth daily with breakfast.     [provider]  methotrexate (RHEUMATREX) 2.5 MG tablet Take 17.5 mg by mouth once a week. Caution:Chemotherapy. Protect from light. Take 7 tabs weekly; Mondays  [provider]    Allergies    Penicillins, Red dye, Latex, Sulfa antibiotics, and Tetanus toxoids  Review of Systems   Review of Systems  All other systems reviewed and are negative.   Physical Exam Updated Vital Signs BP (!) 148/59 (BP Location: Right Arm)   Pulse 70   Temp 98.7 F (37.1 C) (Oral)   Resp 16   SpO2 100%   Physical Exam Vitals and nursing note reviewed.  Constitutional:      General: She is not in acute distress.    Appearance: Normal appearance. She is obese. She is not ill-appearing.  HENT:     Head: Normocephalic and atraumatic.     Right Ear:  External ear normal.     Left Ear: External ear normal.     Nose: Nose normal.  Eyes:     Extraocular Movements: Extraocular movements intact.     Pupils: Pupils are equal, round, and reactive to light.  Cardiovascular:     Rate and Rhythm: Normal rate and regular rhythm.     Pulses: Normal pulses.     Heart sounds: Normal heart sounds.  Pulmonary:     Effort: Pulmonary effort is normal.     Breath sounds: Normal breath sounds.  Abdominal:     General: Abdomen is flat.  Musculoskeletal:        General: No swelling or tenderness. Normal range of motion.     Cervical back: Normal range of motion and neck supple.  Skin:    General: Skin is warm and dry.  Neurological:     General: No focal deficit present.     Mental Status: She is alert and oriented to person, place, and time. Mental status is at baseline.     Cranial Nerves: No cranial nerve deficit.     Sensory: No sensory deficit.     Motor: No weakness.     Coordination: Coordination normal.     Gait: Gait normal.     Deep Tendon Reflexes: Reflexes normal.  Psychiatric:        Mood and Affect: Mood normal.        Behavior: Behavior normal.     ED Results / Procedures / Treatments   Labs (all labs ordered are listed, but only abnormal results are displayed) Labs Reviewed  CBG MONITORING, ED    EKG EKG Interpretation  Date/Time:  Saturday February 22 2019 12:19:21 EST Ventricular Rate:  72 PR Interval:  152 QRS Duration: 82 QT Interval:  396 QTC Calculation: 433 R Axis:   58 Text Interpretation: Normal sinus rhythm Normal ECG credit Confirmed by Margarita Grizzle [phone]) on 02/22/2019 12:38:59 PM   Radiology CT Head Wo Contrast  Result Date: 02/22/2019 CLINICAL DATA:  Dizziness. EXAM: CT HEAD WITHOUT CONTRAST TECHNIQUE: Contiguous axial images were obtained from the base of the skull through the vertex without intravenous contrast. COMPARISON:  None. FINDINGS: Brain: No evidence of acute infarction, hemorrhage,  hydrocephalus, extra-axial collection or mass lesion/mass effect. 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 This is associated with left frontotemporal craniotomy changes. Vascular: Signs of presumed aneurysm clipping otherwise normal appearance of vascular structures on noncontrast evaluation with scattered atherosclerotic changes. Skull: No acute finding. Signs of left frontotemporal craniotomy presumably for aneurysm clipping as described. Sinuses/Orbits: No acute finding. Other: None. IMPRESSION: 1. No acute intracranial abnormality by noncontrast CT. 2. 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 Electronically Signed  By: Donzetta Kohut M.D.   On: 02/22/2019 13:31   DG Chest Port 1 View  Result Date: 02/22/2019 CLINICAL DATA:  Dyspnea.  Dizziness. EXAM: PORTABLE CHEST 1 VIEW COMPARISON:  February 11, 2019 FINDINGS: Stable right Port-A-Cath. The cardiomediastinal silhouette is stable. The lungs are clear. No pneumothorax. IMPRESSION: No active disease. Electronically Signed   By: Gerome Sam III M.D   On: 02/22/2019 13:32    Procedures Procedures (including critical care time)  Medications Ordered in ED Medications - No data to display  ED Course  I have reviewed the triage vital signs and the nursing notes.  Pertinent labs & imaging results that were available during my care of the patient were reviewed by me and considered in my medical decision making (see chart for details).    MDM Rules/Calculators/A&P                     78 year old female presents today with generalized weakness and 2 episodes of vertigo.  She has been being evaluated over the past month for increasing dyspnea.  Here on her exam head CT and plain chest x-Gavrielle Streck showed no acute abnormalities.  Labs are Diagnostic.  She does have an elevated D-dimer.  Secondary to this and the dyspnea over the past month, CT angio has  been ordered and is pending. Reviewed CT results.  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  Discussed with Dr. Maren Beach and he will be in to see.  I discussed this finding with the patient and she is aware that Dr. Alla German is in route Patient states she is to see Dr. Donata Clay as op in several weeks for f/u of mass No acute findings here in ed to explain patients dyspnea or weakness.  She has remained hemodynamically stable and is discharged with f/u plan in place and return precautions discussed.  Final Clinical Impression(s) / ED Diagnoses Final diagnoses:  Lightheadedness  Weakness  Dyspnea, unspecified type  Mediastinal mass    Rx / DC Orders ED Discharge Orders    None    No definitive cause of your dizziness , weakness, or shortness of breath was found.  Incidentally, a mass was seen in your chest.  Please follow up with Dr. Donata Clay as discussed with him.  Drink plenty of fluids and follow up with your primary doctor this week.  Return to the ED if you are worse at any time.   Margarita Grizzle, MD 02/22/19 [phone]

## 2019-02-22 NOTE — Discharge Instructions (Addendum)
No definitive cause of your dizziness , weakness, or shortness of breath was found.  Incidentally, a mass was seen in your chest.  Please follow up with Dr. Prescott Gum as discussed with him.  Drink plenty of fluids and follow up with your primary doctor this week.  Return to the ED if you are worse at any time.

## 2019-02-22 NOTE — Consult Note (Signed)
[address]       [place] [id number]             [phone]        Ivar BuryRamona Minnehan [facility] Medical Record #[id number] Date of Birth: [birth year]  Referring Dr. Rosalia Hammersay, emergency department : Primary Care: Aliene BeamsHagler, Rachel, MD Primary Cardiologist:No primary care provider on file.  Chief Complaint:    Chief Complaint  Patient presents with  . Dizziness   Patient examined and images of CTA scan of chest personally reviewed and discussed with Dr. Rosalia Hammersay for coordination of care.  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  There is no evidence of significant aortic disease.  No evidence of pericardial effusion.  Lung windows look fairly clear and no other abnormal mediastinal adenopathy.  There are no previous CT scans with which to compare.  The patient denies chest pain.  No previous history of malignancy.  No history of smoking for 12 years.  No evidence of COPD on her lung windows.  Minimal coronary calcification on the CTA with minimal thoracic aortic calcification without aneurysm.  Patient has been recently diagnosed with neuropathy-weakness of her lower extremities which has been treated with IVIG through a Port-A-Cath with improved symptoms.  This was coordinated by her neurologist Dr. Daisy BlossomAhearn who did an extensive evaluation of her lower extremity weakness including MRI, EMG, autoimmune markers and immunoelectrophoresis.  There is a note on elevated ganglionic neuronal antibodies but not specifically for myasthenia.  Her weakness is mainly lower extremity and she specifically denies difficulty with swallowing or double vision or ptosis.  Current Activity/ Functional Status: Patient is retired and lives with family and is  fairly functional that she has not needed a cane since starting IVIG   Zubrod Score: At the time of surgery this patient's most appropriate activity status/level should be described as: []     0    Normal activity, no symptoms [x]     1    Restricted in physical strenuous activity but ambulatory, able to do out light work []     2    Ambulatory and capable of self care, unable to do work activities, up and about                 more than 50%  Of the time                            []     3    Only limited self care, in bed greater than 50% of waking hours []     4    Completely disabled, no self care, confined to bed or chair []     5    Moribund  Past Medical History:  Diagnosis Date  . Anxiety   . Depression   . Diabetes (HCC)   . Headache   . Hyperlipidemia   . Hypertension   . Neuropathy   . Rheumatoid aortitis     Past Surgical History:  Procedure Laterality Date  . APPENDECTOMY    . BRAIN SURGERY    . IR IMAGING GUIDED PORT INSERTION  01/01/2019  . SHOULDER SURGERY Right     Social History   Tobacco  Use  Smoking Status Former Smoker  Smokeless Tobacco Never Used    Social History   Substance and Sexual Activity  Alcohol Use Never     Allergies  Allergen Reactions  . Penicillins     Did it involve swelling of the face/tongue/throat, SOB, or low BP? No Did it involve sudden or severe rash/hives, skin peeling, or any reaction on the inside of your mouth or nose? Yes Did you need to seek medical attention at a hospital or doctor's office? Yes When did it last happen?in her 3s If all above answers are "NO", may proceed with cephalosporin use.   . Red Dye Itching  . Latex Rash  . Sulfa Antibiotics Rash    Blisters  . Tetanus Toxoids Rash    No current facility-administered medications for this encounter.   Current Outpatient Medications  Medication Sig Dispense Refill  . Ascorbic Acid (VITAMIN C PO) Take 1,000 mg by mouth daily.    Marland Kitchen atorvastatin  (LIPITOR) 20 MG tablet Take 20 mg by mouth daily.    Marland Kitchen b complex vitamins tablet Take 2 tablets by mouth daily.     . calcium carbonate (TUMS - DOSED IN MG ELEMENTAL CALCIUM) 500 MG chewable tablet Chew 1 tablet by mouth daily.    . folic acid (FOLVITE) 1 MG tablet Take 1 mg by mouth daily.    . Immune Globulin, Human, (GAMMAGARD IV) Inject into the vein See admin instructions. Mon - Fri    . lidocaine-prilocaine (EMLA) cream Apply 1 application topically as needed. 30 g 6  . lisinopril-hydrochlorothiazide (ZESTORETIC) 20-25 MG tablet Take 1 tablet by mouth daily.    . metFORMIN (GLUCOPHAGE) 500 MG tablet Take 500 mg by mouth daily with breakfast.     . methotrexate (RHEUMATREX) 2.5 MG tablet Take 17.5 mg by mouth once a week. Caution:Chemotherapy. Protect from light. Take 7 tabs weekly; Mondays      (Not in a hospital admission)   Family History  Problem Relation Age of Onset  . 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 Head CT scan today shows no significant abnormality. Her dizziness has been evaluated by Dr. Lavell Anchors and she has received physical therapy for vestibular therapy. Patient has dyspnea on exertion which is fairly new in the past 2 months.  No associated shortness of breath.  She has an echocardiogram pending to evaluate her LV function and valvular function. No previous history of cardiac disease cardiac murmur or arrhythmia or rheumatic heart disease as a child    Cardiac Review of Systems: Y or  [    ]= no  Chest Pain [    ]  Resting SOB [   ] Exertional SOB  [ y ]  Orthopnea [  ] history of hypertension y   Pedal Edema [   ]    Palpitations [   ] Syncope  [  ]   Presyncope [ y  ]  General Review of Systems: [Y] = yes [  ]=no Constitional: recent weight change Blue.Reese  ]; anorexia [  ]; fatigue Blue.Reese  ]; nausea [  ]; night sweats [  ]; fever [  ]; or chills [  ]  Dental: Last Dentist visit: 1 year  Eye : blurred vision [  ]; diplopia [   ]; vision changes [  ];  Amaurosis fugax[  ]; Resp: cough [  ];  wheezing[  ];  hemoptysis[  ]; shortness of breath[y  ]; paroxysmal nocturnal dyspnea[  ]; dyspnea on exertion[  ]; or orthopnea[  ];  GI:  gallstones[  ], vomiting[  ];  dysphagia[  ]; melena[  ];  hematochezia [  ]; heartburn[  y];   Hx of  Colonoscopy[  ]; GU: kidney stones [  ]; hematuria[  ];   dysuria [  ];  nocturia[  ];  history of     obstruction [  ]; urinary frequency [  ]             Skin: rash, swelling[  ];, hair loss[  ];  peripheral edema[  ];  or itching[  ]; Musculosketetal: myalgias[  ];  joint swelling[ y ];  joint erythema[  ];  joint pain[  ];  back pain[  ];  Heme/Lymph: bruising[  ];  bleeding[  ];  anemia[  ];  Neuro: TIA[  ];  headaches[  ];  stroke[  ];  vertigo[  ];  seizures[  ];   paresthesias[  ];  difficulty walking[  ]; history of brain aneurysm clipping  Psych:depression[  ]; anxiety[  ];  Endocrine: diabetes[  y];  thyroid dysfunction[  ];                         Physical Exam: BP (!) 120/55   Pulse 68   Temp 98.7 F (37.1 C) (Oral)   Resp 11   SpO2 98%        Physical Exam  General:  HEENT: Normocephalic pupils equal , dentition adequate Neck: Supple without JVD, adenopathy, or bruit Chest: Clear to auscultation, symmetrical breath sounds, no rhonchi, no tenderness             or deformity Cardiovascular: Regular rate and rhythm, no murmur, no gallop, peripheral pulses             palpable in all extremities Abdomen:  Soft, nontender, no palpable mass or organomegaly Extremities: Warm, well-perfused, no clubbing cyanosis edema or tenderness,              no venous stasis changes of the legs Rectal/GU: Deferred Neuro: Grossly non--focal and symmetrical  throughout Skin: Clean and dry without rash or ulcerationl/GU: Deferred Neuro: Grossly non--focal and symmetrical throughout, right hand grip slightly weaker than left hand grip Skin: Clean and dry without rash or ulceration   Diagnostic Studies & Laboratory data:     Recent Radiology Findings:   CT Head Wo Contrast  Result Date: 02/22/2019 CLINICAL DATA:  Dizziness. EXAM: CT HEAD WITHOUT CONTRAST TECHNIQUE: Contiguous axial images were obtained from the base of the skull through the vertex without intravenous contrast. COMPARISON:  None. FINDINGS: Brain: No evidence of acute infarction, hemorrhage, hydrocephalus, extra-axial collection or mass lesion/mass effect. 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 This is associated with left frontotemporal craniotomy changes. Vascular: Signs of presumed aneurysm clipping otherwise normal appearance of vascular structures on noncontrast evaluation with scattered atherosclerotic changes. Skull: No acute finding. Signs of left frontotemporal craniotomy presumably for aneurysm clipping as described. Sinuses/Orbits: No acute finding. Other: None. IMPRESSION: 1. No acute intracranial abnormality by noncontrast CT. 2. Left frontotemporal craniotomy with signs of aneurysm  clipping and mild associated left temporal encephalomalacia. Electronically Signed   By: Donzetta Kohut M.D.   On: 02/22/2019 13:31   CT Angio Chest PE W and/or Wo Contrast  Result Date: 02/22/2019 CLINICAL DATA:  79 year old female with shortness of breath for 1 month. EXAM: CT ANGIOGRAPHY CHEST WITH CONTRAST TECHNIQUE: Multidetector CT imaging of the chest was performed using the standard protocol during bolus administration of intravenous contrast. Multiplanar CT image reconstructions and MIPs were obtained to evaluate the vascular anatomy. CONTRAST:  48mL OMNIPAQUE IOHEXOL 350 MG/ML SOLN COMPARISON:  02/22/2019 and prior radiographs FINDINGS: Cardiovascular: This is  a technically satisfactory study. No pulmonary emboli are identified. Cardiomegaly identified. Coronary artery atherosclerotic calcifications are present. Thoracic aortic atherosclerotic calcifications noted without evidence of aneurysm. No pericardial effusion. A RIGHT Port-A-Cath is noted with tip in the LOWER SVC. Mediastinum/Nodes: A 1.7 x 2.2 cm oval mass within the anterior mediastinum is noted with Hounsfield units of 25 (series 5: Image 40) and does not contain fat nor calcium. No other mediastinal mass or enlarged lymph nodes are identified. Calcified mediastinal lymph nodes are present. The esophagus, trachea and visualized thyroid gland are unremarkable. Lungs/Pleura: Mild bibasilar atelectasis/scarring noted. A calcified granuloma within the LEFT LOWER lobe is present. No suspicious nodule, mass, airspace disease, consolidation, pleural effusion or pneumothorax identified. Upper Abdomen: No acute abnormality. Musculoskeletal: No acute or suspicious abnormality. Review of the MIP images confirms the above findings. IMPRESSION: 1. No acute abnormality.  No evidence of pulmonary emboli. 2. Indeterminate 1.7 x 2.2 cm anterior mediastinal mass. Consider thoracic surgical consultation/PET-CT. 3. Cardiomegaly and coronary artery disease. 4.  Aortic Atherosclerosis (ICD10-I70.0). Electronically Signed   By: Harmon Pier M.D.   On: 02/22/2019 15:48   DG Chest Port 1 View  Result Date: 02/22/2019 CLINICAL DATA:  Dyspnea.  Dizziness. EXAM: PORTABLE CHEST 1 VIEW COMPARISON:  February 11, 2019 FINDINGS: Stable right Port-A-Cath. The cardiomediastinal silhouette is stable. The lungs are clear. No pneumothorax. IMPRESSION: No active disease. Electronically Signed   By: Gerome Sam III M.D   On: 02/22/2019 13:32     I have independently reviewed the above radiologic studies and discussed with the patient   Recent Lab Findings: Lab Results  Component Value Date   WBC 4.4 02/22/2019   HGB 11.7 (L)  02/22/2019   HCT 35.2 (L) 02/22/2019   PLT 363 02/22/2019   GLUCOSE 83 02/22/2019   ALT 32 02/22/2019   AST 86 (H) 02/22/2019   NA 136 02/22/2019   K 4.8 02/22/2019   CL 103 02/22/2019   CREATININE 0.77 02/22/2019   BUN 15 02/22/2019   CO2 24 02/22/2019   TSH 2.040 09/30/2018   INR 1.0 01/01/2019   HGBA1C <4.2 (L) 09/30/2018      Assessment / Plan:   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  I still recommended that she undergo a PET scan to further assess the malignant potential.  It is not accessible for biopsy due to proximity to the aorta.  The possibility of thymoma could be related to her recent onset of lower extremity weakness.  She will need testing of acetylcholine  receptor antibody.    I will see the patient back in the office after the PET scan.   @ME1 @ 02/22/2019 5:26 PM

## 2019-02-24 ENCOUNTER — Other Ambulatory Visit: Payer: Self-pay | Admitting: Cardiothoracic Surgery

## 2019-02-24 DIAGNOSIS — J9859 Other diseases of mediastinum, not elsewhere classified: Secondary | ICD-10-CM

## 2019-03-05 ENCOUNTER — Ambulatory Visit (HOSPITAL_COMMUNITY): Payer: Medicare Other

## 2019-03-06 ENCOUNTER — Ambulatory Visit (HOSPITAL_COMMUNITY)
Admission: RE | Admit: 2019-03-06 | Discharge: 2019-03-06 | Disposition: A | Discharge status: Home / Self Care | Payer: Medicare Other | Source: Ambulatory Visit | Attending: Cardiothoracic Surgery | Admitting: Cardiothoracic Surgery

## 2019-03-06 ENCOUNTER — Other Ambulatory Visit: Payer: Self-pay

## 2019-03-06 DIAGNOSIS — E119 Type 2 diabetes mellitus without complications: Secondary | ICD-10-CM | POA: Insufficient documentation

## 2019-03-06 DIAGNOSIS — J9859 Other diseases of mediastinum, not elsewhere classified: Secondary | ICD-10-CM | POA: Diagnosis not present

## 2019-03-06 DIAGNOSIS — Z7984 Long term (current) use of oral hypoglycemic drugs: Secondary | ICD-10-CM | POA: Diagnosis not present

## 2019-03-06 DIAGNOSIS — E785 Hyperlipidemia, unspecified: Secondary | ICD-10-CM | POA: Insufficient documentation

## 2019-03-06 DIAGNOSIS — I10 Essential (primary) hypertension: Secondary | ICD-10-CM | POA: Insufficient documentation

## 2019-03-06 DIAGNOSIS — Z79899 Other long term (current) drug therapy: Secondary | ICD-10-CM | POA: Diagnosis not present

## 2019-03-06 LAB — GLUCOSE, CAPILLARY: Glucose-Capillary: 77 mg/dL (ref 70–99)

## 2019-03-06 MED ORDER — FLUDEOXYGLUCOSE F - 18 (FDG) INJECTION
8.3400 | Freq: Once | INTRAVENOUS | Status: AC
  Administered 2019-03-06: 8.34 via INTRAVENOUS

## 2019-03-12 ENCOUNTER — Ambulatory Visit: Payer: Medicare Other | Admitting: Cardiothoracic Surgery

## 2019-03-19 ENCOUNTER — Telehealth: Payer: Self-pay | Admitting: Cardiothoracic Surgery

## 2019-03-19 ENCOUNTER — Telehealth: Payer: Medicare Other | Admitting: Cardiothoracic Surgery

## 2019-03-19 ENCOUNTER — Other Ambulatory Visit: Payer: Self-pay

## 2019-03-19 NOTE — Telephone Encounter (Signed)
[address]       [place] [address]             [phone]     CARDIOTHORACIC SURGERY TELEPHONE VIRTUAL OFFICE NOTE  Referring Provider is No ref. provider found Primary Cardiologist is No primary care provider on file. PCP is Caren Macadam, MD   HPI:  I spoke with Sarah Whitaker (DOB January 21, 1941 ) via telephone on 03/19/2019 at 6:43 PM and verified that I was speaking with the correct person using more than one form of identification.  We discussed the reason(s) for conducting our visit virtually instead of in-person.  The patient expressed understanding the circumstances and agreed to proceed as described.  I had a discussion with the patient about her PET scan today.  She is originally seen in consultation for an incidental finding of a 2 cm smooth round 2 cm mediastinal density.  The CT was performed to evaluate shortness of breath and rule out PE.  I reviewed the images and discussed the findings with the patient. 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 In a 79 year old female with neuropathy receiving IVIG I would not recommend surgical resection.  I doubt this would ever cause a significant problem for the patient.  I will have her return in 1 year with a CT scan to make sure that the cyst is stable.  Otherwise no further interaction would be needed.   Current Outpatient Medications  Medication Sig Dispense Refill  . Ascorbic Acid (VITAMIN C PO) Take 1,000 mg by mouth daily.    Marland Kitchen atorvastatin (LIPITOR) 20 MG tablet Take 20 mg by mouth daily.    Marland Kitchen b complex vitamins tablet Take 2 tablets by mouth daily.     . calcium carbonate (TUMS - DOSED IN MG ELEMENTAL CALCIUM) 500 MG chewable tablet Chew 1 tablet by mouth daily.    . folic acid (FOLVITE) 1 MG tablet Take 1 mg by mouth daily.    . Immune Globulin, Human, (GAMMAGARD IV) Inject into the vein See admin instructions. Mon - Fri    . lidocaine-prilocaine  (EMLA) cream Apply 1 application topically as needed. 30 g 6  . lisinopril-hydrochlorothiazide (ZESTORETIC) 20-25 MG tablet Take 1 tablet by mouth daily.    . metFORMIN (GLUCOPHAGE) 500 MG tablet Take 500 mg by mouth daily with breakfast.     . methotrexate (RHEUMATREX) 2.5 MG tablet Take 17.5 mg by mouth once a week. Caution:Chemotherapy. Protect from light. Take 7 tabs weekly; Mondays     No current facility-administered medications for this visit.     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    I discussed limitations of evaluation and management via telephone.  The patient was advised to call back for repeat telephone consultation or to seek an in-person evaluation if questions arise or the patient's clinical condition changes in any significant manner.  I spent in excess of 5  minutes of non-face-to-face time during the conduct of this telephone virtual office consultation.  Level 1  (99441)             5-10 minutes Level 2  (35009)  11-20 minutes Level 3  (99443)            21-30 minutes    03/19/2019 6:43 PM

## 2019-04-01 DIAGNOSIS — R0602 Shortness of breath: Secondary | ICD-10-CM | POA: Insufficient documentation

## 2019-04-01 DIAGNOSIS — Z7189 Other specified counseling: Secondary | ICD-10-CM | POA: Insufficient documentation

## 2019-04-01 DIAGNOSIS — R011 Cardiac murmur, unspecified: Secondary | ICD-10-CM | POA: Insufficient documentation

## 2019-04-01 NOTE — Progress Notes (Signed)
Cardiology Office Note   Date:  04/02/2019   ID:  Sarah Whitaker, DOB 07-Jun-1940, MRN [id number]  PCP:  Aliene Beams, MD  Cardiologist:   No primary care provider on file. Referring, Aliene Beams, MD  Chief Complaint  Patient presents with  . Shortness of Breath      History of Present Illness: Sarah Whitaker is a 79 y.o. female who presents for evaluation of SOB and a murmur.   Patient was in the emergency room December.  She had dizziness.  This was a couple of episodes lasting for several minutes.  It was not like previous vertigo that she had.  She did not have any syncope.  She did not feel her heart racing or skipping.  The room was not necessarily spinning.  There was no clear etiology to this.  She did have a CT of her chest I noted that there was a questionable mediastinal mass.  She was actually seen in consultation by Dr. Donata Clay.  He ordered a follow-up PET scan and there was no evidence of this mass was malignant.  He is probably cystic.  He had some coronary calcifications noted and some aortic atherosclerosis.  She has had some dyspnea with exertion.  She is limited somewhat by neuropathy and gets IgG infusions for this.  She gets short of breath with moderate activity more mild activity like walking across her house but she thinks this has been relatively stable.  She is not getting PND or orthopnea.  She is not having chest pressure, neck or arm discomfort.  He denies any weight gain or edema.  She is never had any other cardiac work-up.   Past Medical History:  Diagnosis Date  . Anxiety   . Depression   . Headache   . Hyperlipidemia   . Hypertension   . Neuropathy   . Rheumatoid aortitis     Past Surgical History:  Procedure Laterality Date  . APPENDECTOMY    . BRAIN SURGERY     Repair of an aneurysm  . IR IMAGING GUIDED PORT INSERTION  01/01/2019  . SHOULDER SURGERY Right      Current Outpatient Medications  Medication Sig Dispense Refill  . Ascorbic  Acid (VITAMIN C PO) Take 1,000 mg by mouth daily.    . ASPIRIN 81 PO Take 81 mg by mouth.    Marland Kitchen atorvastatin (LIPITOR) 20 MG tablet Take 20 mg by mouth daily.    Marland Kitchen b complex vitamins tablet Take 2 tablets by mouth daily.     . calcium carbonate (TUMS - DOSED IN MG ELEMENTAL CALCIUM) 500 MG chewable tablet Chew 1 tablet by mouth daily.    . folic acid (FOLVITE) 1 MG tablet Take 1 mg by mouth daily.    . Immune Globulin, Human, (GAMMAGARD IV) Inject into the vein.    Marland Kitchen lisinopril-hydrochlorothiazide (ZESTORETIC) 20-25 MG tablet Take 1 tablet by mouth daily.    . methotrexate (RHEUMATREX) 2.5 MG tablet Take 17.5 mg by mouth once a week. Caution:Chemotherapy. Protect from light. Take 7 tabs weekly; Mondays     No current facility-administered medications for this visit.    Allergies:   Penicillins, Red dye, Latex, Sulfa antibiotics, and Tetanus toxoids    Social History:  The patient  reports that she has quit smoking. She has never used smokeless tobacco. She reports that she does not drink alcohol or use drugs.   Family History:  The patient's family history includes Aneurysm in her mother and  sister; Heart disease (age of onset: 8) in her father.    ROS:  Please see the history of present illness.   Otherwise, review of systems are positive for none.   All other systems are reviewed and negative.    PHYSICAL EXAM: VS:  BP 118/62   Pulse 83   Temp (!) 93.4 F (34.1 C)   Ht 5' 2.5" (1.588 m)   Wt 185 lb 3.2 oz (84 kg)   SpO2 100%   BMI 33.33 kg/m  , BMI Body mass index is 33.33 kg/m. GENERAL:  Well appearing HEENT:  Pupils equal round and reactive, fundi not visualized, oral mucosa unremarkable NECK:  No jugular venous distention, waveform within normal limits, carotid upstroke brisk and symmetric, no bruits, no thyromegaly LYMPHATICS:  No cervical, inguinal adenopathy LUNGS:  Clear to auscultation bilaterally BACK:  No CVA tenderness CHEST:  Unremarkable HEART:  PMI not  displaced or sustained,S1 and S2 within normal limits, no S3, no S4, no clicks, no rubs, very soft systolic murmur very brief at the right upper sternal border, no diastolic murmurs ABD:  Flat, positive bowel sounds normal in frequency in pitch, no bruits, no rebound, no guarding, no midline pulsatile mass, no hepatomegaly, no splenomegaly EXT:  2 plus pulses throughout, no edema, no cyanosis no clubbing SKIN:  No rashes no nodules NEURO:  Cranial nerves II through XII grossly intact, motor grossly intact throughout PSYCH:  Cognitively intact, oriented to person place and time    EKG:  EKG is ordered today. The ekg ordered today demonstrates sinus rhythm, rate 82, axis within normal limits, intervals within normal limits, no acute ST-T wave changes.   Recent Labs: 09/30/2018: TSH 2.040 02/22/2019: ALT 32; BUN 15; Creatinine, Ser 0.77; Hemoglobin 11.7; Platelets 363; Potassium 4.8; Sodium 136    Lipid Panel No results found for: CHOL, TRIG, HDL, CHOLHDL, VLDL, LDLCALC, LDLDIRECT    Wt Readings from Last 3 Encounters:  04/02/19 185 lb 3.2 oz (84 kg)  01/01/19 189 lb (85.7 kg)  12/16/18 189 lb (85.7 kg)      Other studies Reviewed: Additional studies/ records that were reviewed today include: ED records, PET, MRI,consult note, primary care office records. Review of the above records demonstrates:  Please see elsewhere in the note.     ASSESSMENT AND PLAN:  MURMUR:   Patient does have some dyspnea on a murmur.  There is also anterior mediastinal structures look to be cystic.  I am going to follow-up with an echocardiogram.  SOB: I doubt that this is cardiac in etiology.  I will check the echo.  I will check a BNP level.  If these are normal I will have her exercise and lose weight and only if her shortness of breath gets worse when I consider evaluating her coronary calcifications with a perfusion study.  I do not think her shortness of breath is an ischemic etiology.  (Asked  patient-office I did see a BNP level of 103 recently)  MEDIASTINAL MASS: This will be evaluated as above.    Current medicines are reviewed at length with the patient today.  The patient does not have concerns regarding medicines.  The following changes have been made:  no change  Labs/ tests ordered today include:   Orders Placed This Encounter  Procedures  . Brain natriuretic peptide  . EKG 12-Lead  . ECHOCARDIOGRAM COMPLETE     Disposition:   FU with me as needed.      Signed, Minus Breeding,  MD  04/02/2019 5:12 PM    [facility]

## 2019-04-02 ENCOUNTER — Encounter: Payer: Self-pay | Admitting: Cardiology

## 2019-04-02 ENCOUNTER — Ambulatory Visit (INDEPENDENT_AMBULATORY_CARE_PROVIDER_SITE_OTHER): Payer: Medicare Other | Admitting: Cardiology

## 2019-04-02 ENCOUNTER — Other Ambulatory Visit: Payer: Self-pay

## 2019-04-02 VITALS — BP 118/62 | HR 83 | Temp 93.4°F | Ht 62.5 in | Wt 185.2 lb

## 2019-04-02 DIAGNOSIS — R011 Cardiac murmur, unspecified: Secondary | ICD-10-CM | POA: Diagnosis not present

## 2019-04-02 DIAGNOSIS — R0602 Shortness of breath: Secondary | ICD-10-CM | POA: Diagnosis not present

## 2019-04-02 NOTE — Patient Instructions (Addendum)
Medication Instructions:  No Changes *If you need a refill on your cardiac medications before your next appointment, please call your pharmacy*  Lab Work: Your physician recommends that you return for lab work today (BNP)   Testing/Procedures: Your physician has requested that you have an echocardiogram. Echocardiography is a painless test that uses sound waves to create images of your heart. It provides your doctor with information about the size and shape of your heart and how well your heart's chambers and valves are working. This procedure takes approximately one hour. There are no restrictions for this procedure. [address]  Follow-Up: At [facility], you and your health needs are our priority.  As part of our continuing mission to provide you with exceptional heart care, we have created designated Provider Care Teams.  These Care Teams include your primary Cardiologist (physician) and Advanced Practice Providers (APPs -  Physician Assistants and Nurse Practitioners) who all work together to provide you with the care you need, when you need it.  Other Instructions Follow up as needed

## 2019-04-03 LAB — BRAIN NATRIURETIC PEPTIDE: BNP: 22.9 pg/mL (ref 0.0–100.0)

## 2019-04-18 ENCOUNTER — Ambulatory Visit (HOSPITAL_COMMUNITY): Payer: Medicare Other | Attending: Internal Medicine

## 2019-04-18 ENCOUNTER — Other Ambulatory Visit: Payer: Self-pay

## 2019-04-18 DIAGNOSIS — R011 Cardiac murmur, unspecified: Secondary | ICD-10-CM | POA: Diagnosis not present

## 2019-07-17 ENCOUNTER — Emergency Department (HOSPITAL_COMMUNITY)
Admission: EM | Admit: 2019-07-17 | Discharge: 2019-07-17 | Disposition: A | Discharge status: Home / Self Care | Payer: Medicare Other | Attending: Emergency Medicine | Admitting: Emergency Medicine

## 2019-07-17 ENCOUNTER — Emergency Department (HOSPITAL_COMMUNITY): Payer: Medicare Other

## 2019-07-17 ENCOUNTER — Other Ambulatory Visit: Payer: Self-pay

## 2019-07-17 ENCOUNTER — Encounter (HOSPITAL_COMMUNITY): Payer: Self-pay | Admitting: Emergency Medicine

## 2019-07-17 DIAGNOSIS — I10 Essential (primary) hypertension: Secondary | ICD-10-CM | POA: Diagnosis not present

## 2019-07-17 DIAGNOSIS — Z7982 Long term (current) use of aspirin: Secondary | ICD-10-CM | POA: Insufficient documentation

## 2019-07-17 DIAGNOSIS — Z87891 Personal history of nicotine dependence: Secondary | ICD-10-CM | POA: Insufficient documentation

## 2019-07-17 DIAGNOSIS — Y939 Activity, unspecified: Secondary | ICD-10-CM | POA: Diagnosis not present

## 2019-07-17 DIAGNOSIS — Z79899 Other long term (current) drug therapy: Secondary | ICD-10-CM | POA: Diagnosis not present

## 2019-07-17 DIAGNOSIS — R1110 Vomiting, unspecified: Secondary | ICD-10-CM | POA: Diagnosis not present

## 2019-07-17 DIAGNOSIS — X58XXXA Exposure to other specified factors, initial encounter: Secondary | ICD-10-CM | POA: Insufficient documentation

## 2019-07-17 DIAGNOSIS — R42 Dizziness and giddiness: Secondary | ICD-10-CM | POA: Insufficient documentation

## 2019-07-17 DIAGNOSIS — Y999 Unspecified external cause status: Secondary | ICD-10-CM | POA: Diagnosis not present

## 2019-07-17 DIAGNOSIS — R55 Syncope and collapse: Secondary | ICD-10-CM | POA: Diagnosis not present

## 2019-07-17 DIAGNOSIS — S82832A Other fracture of upper and lower end of left fibula, initial encounter for closed fracture: Secondary | ICD-10-CM | POA: Diagnosis not present

## 2019-07-17 DIAGNOSIS — Y92039 Unspecified place in apartment as the place of occurrence of the external cause: Secondary | ICD-10-CM | POA: Insufficient documentation

## 2019-07-17 HISTORY — DX: Dizziness and giddiness: R42

## 2019-07-17 LAB — COMPREHENSIVE METABOLIC PANEL
ALT: 35 U/L (ref 0–44)
AST: 49 U/L — ABNORMAL HIGH (ref 15–41)
Albumin: 3 g/dL — ABNORMAL LOW (ref 3.5–5.0)
Alkaline Phosphatase: 72 U/L (ref 38–126)
Anion gap: 10 (ref 5–15)
BUN: 13 mg/dL (ref 8–23)
CO2: 22 mmol/L (ref 22–32)
Calcium: 8.8 mg/dL — ABNORMAL LOW (ref 8.9–10.3)
Chloride: 102 mmol/L (ref 98–111)
Creatinine, Ser: 0.92 mg/dL (ref 0.44–1.00)
GFR calc Af Amer: >60 mL/min (ref >60)
GFR calc non Af Amer: >60 mL/min (ref >60)
Glucose, Bld: 170 mg/dL — ABNORMAL HIGH (ref 70–99)
Potassium: 4 mmol/L (ref 3.5–5.1)
Sodium: 134 mmol/L — ABNORMAL LOW (ref 135–145)
Total Bilirubin: 0.9 mg/dL (ref 0.3–1.2)
Total Protein: 7.9 g/dL (ref 6.5–8.1)

## 2019-07-17 LAB — CBC WITH DIFFERENTIAL/PLATELET
Abs Immature Granulocytes: 0.05 10*3/uL (ref 0.00–0.07)
Basophils Absolute: 0 10*3/uL (ref 0.0–0.1)
Basophils Relative: 0 %
Eosinophils Absolute: 0.1 10*3/uL (ref 0.0–0.5)
Eosinophils Relative: 1 %
HCT: 29.3 % — ABNORMAL LOW (ref 36.0–46.0)
Hemoglobin: 9.3 g/dL — ABNORMAL LOW (ref 12.0–15.0)
Immature Granulocytes: 1 %
Lymphocytes Relative: 14 %
Lymphs Abs: 1.2 10*3/uL (ref 0.7–4.0)
MCH: 33.3 pg (ref 26.0–34.0)
MCHC: 31.7 g/dL (ref 30.0–36.0)
MCV: 105 fL — ABNORMAL HIGH (ref 80.0–100.0)
Monocytes Absolute: 0.7 10*3/uL (ref 0.1–1.0)
Monocytes Relative: 9 %
Neutro Abs: 6.2 10*3/uL (ref 1.7–7.7)
Neutrophils Relative %: 75 %
Platelets: 305 10*3/uL (ref 150–400)
RBC: 2.79 MIL/uL — ABNORMAL LOW (ref 3.87–5.11)
RDW: 20.8 % — ABNORMAL HIGH (ref 11.5–15.5)
WBC: 8.2 10*3/uL (ref 4.0–10.5)
nRBC: 0.4 % — ABNORMAL HIGH (ref 0.0–0.2)

## 2019-07-17 MED ORDER — ONDANSETRON 4 MG PO TBDP: 4.0000 mg | ORAL_TABLET | Freq: Three times a day (TID) | ORAL | 1 refills | Status: AC | PRN

## 2019-07-17 MED ORDER — ONDANSETRON HCL 4 MG/2ML IJ SOLN
4.0000 mg | Freq: Once | INTRAMUSCULAR | Status: AC
  Administered 2019-07-17: 4 mg via INTRAVENOUS
  Filled 2019-07-17: qty 2

## 2019-07-17 MED ORDER — SODIUM CHLORIDE 0.9 % IV SOLN: INTRAVENOUS | Status: DC

## 2019-07-17 MED ORDER — HYDROMORPHONE HCL 2 MG PO TABS: 2.0000 mg | ORAL_TABLET | Freq: Four times a day (QID) | ORAL | 0 refills | Status: AC | PRN

## 2019-07-17 MED ORDER — FENTANYL CITRATE (PF) 100 MCG/2ML IJ SOLN
25.0000 ug | Freq: Once | INTRAMUSCULAR | Status: AC
  Administered 2019-07-17: 25 ug via INTRAVENOUS
  Filled 2019-07-17: qty 2

## 2019-07-17 NOTE — Consult Note (Addendum)
Reason for Consult:Left ankle fx Referring Physician: Darlyn Chamber  Sarah Whitaker is an 79 y.o. female.  HPI: Carter got up last night to go to the bathroom. She collapsed and fell off the toilet onto her right side. Afterwards she noted that her left ankle was hurting. She came to the ED to get the syncope evaluated but x-rays of the ankle showed a fibula fx and orthopedic surgery was consulted. Her ED syncopal workup was benign. She lives at home and sometimes uses a cane to ambulate out of the house if she's going to be walking long distances.  Past Medical History:  Diagnosis Date  . Anxiety   . Depression   . Headache   . Hyperlipidemia   . Hypertension   . Neuropathy   . Rheumatoid aortitis   . Vertigo     Past Surgical History:  Procedure Laterality Date  . APPENDECTOMY    . BRAIN SURGERY     Repair of an aneurysm  . IR IMAGING GUIDED PORT INSERTION  01/01/2019  . SHOULDER SURGERY Right     Family History  Problem Relation Age of Onset  . Aneurysm Mother        Brain  . Heart disease Father 44  . Aneurysm Sister        Brain  . Neuromuscular disorder Neg Hx     Social History:  reports that she has quit smoking. She has never used smokeless tobacco. She reports that she does not drink alcohol or use drugs.  Allergies:  Allergies  Allergen Reactions  . Penicillins     Did it involve swelling of the face/tongue/throat, SOB, or low BP? No Did it involve sudden or severe rash/hives, skin peeling, or any reaction on the inside of your mouth or nose? Yes Did you need to seek medical attention at a hospital or doctor's office? Yes When did it last happen?in her 16s If all above answers are "NO", may proceed with cephalosporin use.   . Red Dye Itching  . Latex Rash  . Sulfa Antibiotics Rash    Blisters  . Tetanus Toxoids Rash    Medications: I have reviewed the patient's current medications.  Results for orders placed or performed during the hospital  encounter of 07/17/19 (from the past 48 hour(s))  Comprehensive metabolic panel     Status: Abnormal   Collection Time: 07/17/19  7:38 AM  Result Value Ref Range   Sodium 134 (L) 135 - 145 mmol/L   Potassium 4.0 3.5 - 5.1 mmol/L   Chloride 102 98 - 111 mmol/L   CO2 22 22 - 32 mmol/L   Glucose, Bld 170 (H) 70 - 99 mg/dL    Comment: Glucose reference range applies only to samples taken after fasting for at least 8 hours.   BUN 13 8 - 23 mg/dL   Creatinine, Ser 7.51 0.44 - 1.00 mg/dL   Calcium 8.8 (L) 8.9 - 10.3 mg/dL   Total Protein 7.9 6.5 - 8.1 g/dL   Albumin 3.0 (L) 3.5 - 5.0 g/dL   AST 49 (H) 15 - 41 U/L   ALT 35 0 - 44 U/L   Alkaline Phosphatase 72 38 - 126 U/L   Total Bilirubin 0.9 0.3 - 1.2 mg/dL   GFR calc non Af Amer >60 >60 mL/min   GFR calc Af Amer >60 >60 mL/min   Anion gap 10 5 - 15    Comment: Performed at [facility], [address], [place],  [place] [id number]  CBC with Differential/Platelet     Status: Abnormal   Collection Time: 07/17/19  7:38 AM  Result Value Ref Range   WBC 8.2 4.0 - 10.5 K/uL   RBC 2.79 (L) 3.87 - 5.11 MIL/uL   Hemoglobin 9.3 (L) 12.0 - 15.0 g/dL   HCT 29.3 (L) 36.0 - 46.0 %   MCV 105.0 (H) 80.0 - 100.0 fL   MCH 33.3 26.0 - 34.0 pg   MCHC 31.7 30.0 - 36.0 g/dL   RDW 20.8 (H) 11.5 - 15.5 %   Platelets 305 150 - 400 K/uL   nRBC 0.4 (H) 0.0 - 0.2 %   Neutrophils Relative % 75 %   Neutro Abs 6.2 1.7 - 7.7 K/uL   Lymphocytes Relative 14 %   Lymphs Abs 1.2 0.7 - 4.0 K/uL   Monocytes Relative 9 %   Monocytes Absolute 0.7 0.1 - 1.0 K/uL   Eosinophils Relative 1 %   Eosinophils Absolute 0.1 0.0 - 0.5 K/uL   Basophils Relative 0 %   Basophils Absolute 0.0 0.0 - 0.1 K/uL   Immature Granulocytes 1 %   Abs Immature Granulocytes 0.05 0.00 - 0.07 K/uL    Comment: Performed at [facility], [address], [place], [place] [address]    DG Ankle Complete Left  Result Date: 07/17/2019 CLINICAL DATA:  Left ankle pain after fall. EXAM:  LEFT ANKLE COMPLETE - 3+ VIEW COMPARISON:  None. FINDINGS: Acute minimally displaced oblique fracture of the distal fibular metaphysis. No additional fracture. No dislocation. The ankle mortise is symmetric. The talar dome is intact. No tibiotalar joint effusion. Joint spaces are preserved. Bone mineralization is normal. Plantar and Achilles enthesophytes. Mild soft tissue swelling over the lateral malleolus. IMPRESSION: 1. Acute minimally displaced fracture of the distal fibular metaphysis. Electronically Signed   By: Titus Dubin M.D.   On: 07/17/2019 08:30    Review of Systems  HENT: Negative for ear discharge, ear pain, hearing loss and tinnitus.   Eyes: Negative for photophobia and pain.  Respiratory: Negative for cough and shortness of breath.   Cardiovascular: Negative for chest pain.  Gastrointestinal: Negative for abdominal pain, nausea and vomiting.  Genitourinary: Negative for dysuria, flank pain, frequency and urgency.  Musculoskeletal: Positive for arthralgias (Left ankle). Negative for back pain, myalgias and neck pain.  Neurological: Negative for dizziness and headaches.  Hematological: Does not bruise/bleed easily.  Psychiatric/Behavioral: The patient is not nervous/anxious.    Blood pressure (!) 123/48, pulse 79, temperature 97.6 F (36.4 C), temperature source Oral, resp. rate 15, height 5\' 1"  (1.549 m), weight 80.7 kg, SpO2 99 %. Physical Exam  Constitutional: She appears well-developed and well-nourished. No distress.  HENT:  Head: Normocephalic and atraumatic.  Eyes: Conjunctivae are normal. Right eye exhibits no discharge. Left eye exhibits no discharge. No scleral icterus.  Cardiovascular: Normal rate and regular rhythm.  Respiratory: Effort normal. No respiratory distress.  Musculoskeletal:     Cervical back: Normal range of motion.     Comments: LLE No traumatic wounds, ecchymosis, or rash  Mod TTP lateral ankle  No knee effusion  Knee stable to varus/ valgus  and anterior/posterior stress  Sens DPN, SPN, TN intact  Motor EHL, ext, flex, evers 5/5  DP 2+, PT 0, No significant edema  Neurological: She is alert.  Skin: Skin is warm and dry. She is not diaphoretic.  Psychiatric: She has a normal mood and affect. Her behavior is normal.    Assessment/Plan: Left  ankle fx -- Will plan on initial non-operative management in a CAM boot and WBAT. She should f/u with Dr. Dion Saucier in 1 week. Multiple medical problems including depression, HLD, HTN, and RLE neuropathy     Freeman Caldron, PA-C Orthopedic Surgery [phone] 07/17/2019, 11:21 AM   Reviewed, discussed, and agree with above.  Minimally displaced distal fibula fracture with intact medial clear space, hopefully will be able to be managed nonsurgically given risk factors and social situation.  Plan for gravity stress view in the office, weightbearing as tolerated with the boot in the meantime  Teryl Lucy, MD.

## 2019-07-17 NOTE — ED Provider Notes (Addendum)
[facility] EMERGENCY DEPARTMENT Provider Note   CSN: [id number] Arrival date & time: 07/17/19  8295     History Chief Complaint  Patient presents with  . Near Syncope    Sarah Whitaker is a 79 y.o. female.  Patient with series of events this morning all probably related to near syncope and some brief periods of vertigo which patient has a longstanding history of and nausea and vomiting. Initially patient was going to the bathroom. She fell getting off the tall at did not hit her head no loss of consciousness. Patient did vomit when she was on the floor. Her daughter got her up from there and she was able to walk to her sitting chair and in the sitting chair she vomited 2 times more and kind of had what the daughter thought was seizure activity but it was all very brief maybe 15 seconds sounds like more near syncope. Patient was having some dizziness at that time. She is complaining of left ankle pain which she was walking on at home. No complaint of any hip pain no back pain no neck pain no head pain. No other extremity pain. Patient did have dental work done yesterday. She had an intense cleaning. Patient denies any fever chest pain shortness of breath any upper respiratory symptoms. Any dysuria. Patient was brought in by EMS. Patient did have a loose bowel movement this morning. No blood in the bowel movement not black in color.        Past Medical History:  Diagnosis Date  . Anxiety   . Depression   . Headache   . Hyperlipidemia   . Hypertension   . Neuropathy   . Rheumatoid aortitis   . Vertigo     Patient Active Problem List   Diagnosis Date Noted  . Murmur 04/01/2019  . SOB (shortness of breath) 04/01/2019  . Educated about COVID-19 virus infection 04/01/2019  . Polyneuropathy 12/16/2018  . 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 Obesity (BMI 30-39.9) 12/16/2018    Past Surgical History:  Procedure Laterality Date    . APPENDECTOMY    . BRAIN SURGERY     Repair of an aneurysm  . IR IMAGING GUIDED PORT INSERTION  01/01/2019  . SHOULDER SURGERY Right      OB History   No obstetric history on file.     Family History  Problem Relation Age of Onset  . Aneurysm Mother        Brain  . Heart disease Father 49  . Aneurysm Sister        Brain  . Neuromuscular disorder Neg Hx     Social History   Tobacco Use  . Smoking status: Former Games developer  . Smokeless tobacco: Never Used  Substance Use Topics  . Alcohol use: Never  . Drug use: Never    Home Medications Prior to Admission medications   Medication Sig Start Date End Date Taking? Authorizing Provider  Ascorbic Acid (VITAMIN C PO) Take 1,000 mg by mouth daily.    [provider]  ASPIRIN 81 PO Take 81 mg by mouth.    [provider]  atorvastatin (LIPITOR) 20 MG tablet Take 20 mg by mouth daily.    [provider]  b complex vitamins tablet Take 2 tablets by mouth daily.     [provider]  calcium carbonate (TUMS - DOSED IN MG ELEMENTAL CALCIUM) 500 MG chewable tablet Chew 1 tablet by mouth daily.  [provider]  folic acid (FOLVITE) 1 MG tablet Take 1 mg by mouth daily.    [provider]  Immune Globulin, Human, (GAMMAGARD IV) Inject into the vein.    [provider]  lisinopril-hydrochlorothiazide (ZESTORETIC) 20-25 MG tablet Take 1 tablet by mouth daily.    [provider]  methotrexate (RHEUMATREX) 2.5 MG tablet Take 17.5 mg by mouth once a week. Caution:Chemotherapy. Protect from light. Take 7 tabs weekly; Mondays    [provider]    Allergies    Penicillins, Red dye, Latex, Sulfa antibiotics, and Tetanus toxoids  Review of Systems   Review of Systems  Constitutional: Negative for chills and fever.  HENT: Negative for congestion, rhinorrhea and sore throat.   Eyes: Negative for visual disturbance.  Respiratory: Negative for cough and  shortness of breath.   Cardiovascular: Negative for chest pain and leg swelling.  Gastrointestinal: Positive for nausea. Negative for abdominal pain, diarrhea and vomiting.  Genitourinary: Negative for dysuria.  Musculoskeletal: Negative for back pain and neck pain.  Skin: Negative for rash.  Neurological: Positive for dizziness. Negative for light-headedness and headaches.  Hematological: Does not bruise/bleed easily.  Psychiatric/Behavioral: Negative for confusion.    Physical Exam Updated Vital Signs BP (!) 123/48   Pulse 79   Temp 97.6 F (36.4 C) (Oral)   Resp 15   Ht 1.549 m (5\' 1" )   Wt 80.7 kg   SpO2 99%   BMI 33.63 kg/m   Physical Exam Vitals and nursing note reviewed.  Constitutional:      General: She is not in acute distress.    Appearance: Normal appearance. She is well-developed.  HENT:     Head: Normocephalic and atraumatic.     Comments: No evidence of any head trauma. Eyes:     Extraocular Movements: Extraocular movements intact.     Conjunctiva/sclera: Conjunctivae normal.     Pupils: Pupils are equal, round, and reactive to light.  Neck:     Comments: No tenderness to palpation to the posterior cervical spine. Cardiovascular:     Rate and Rhythm: Normal rate and regular rhythm.     Heart sounds: No murmur.  Pulmonary:     Effort: Pulmonary effort is normal. No respiratory distress.     Breath sounds: Normal breath sounds.  Abdominal:     Palpations: Abdomen is soft.     Tenderness: There is no abdominal tenderness.  Musculoskeletal:        General: Tenderness present. Normal range of motion.     Cervical back: No rigidity or tenderness.     Comments: Tenderness to palpation over the distal left fibula.  Skin:    General: Skin is warm and dry.     Capillary Refill: Capillary refill takes less than 2 seconds.  Neurological:     General: No focal deficit present.     Mental Status: She is alert and oriented to person, place, and time.      Cranial Nerves: No cranial nerve deficit.     Sensory: No sensory deficit.     Motor: No weakness.     Comments: Patient's neuro exam here without any acute findings. Not able to elicit any vertigo. In particular no weakness in the extremities. Cranial nerves intact. No sensory deficit other than some longstanding neuropathy to her right leg.     ED Results / Procedures / Treatments   Labs (all labs ordered are listed, but only abnormal results are displayed) Labs Reviewed  COMPREHENSIVE METABOLIC PANEL - Abnormal; Notable for the following components:      Result Value   Sodium 134 (*)    Glucose, Bld 170 (*)    Calcium 8.8 (*)    Albumin 3.0 (*)    AST 49 (*)    All other components within normal limits  CBC WITH DIFFERENTIAL/PLATELET - Abnormal; Notable for the following components:   RBC 2.79 (*)    Hemoglobin 9.3 (*)    HCT 29.3 (*)    MCV 105.0 (*)    RDW 20.8 (*)    nRBC 0.4 (*)    All other components within normal limits  URINALYSIS, ROUTINE W REFLEX MICROSCOPIC    EKG EKG Interpretation  Date/Time:  Thursday Jul 17 2019 07:22:30 EDT Ventricular Rate:  67 PR Interval:    QRS Duration: 94 QT Interval:  399 QTC Calculation: 422 R Axis:   40 Text Interpretation: Sinus rhythm Probable left atrial enlargement Borderline T abnormalities, anterior leads No significant change since last tracing Confirmed by Vanetta Mulders [phone]) on 07/17/2019 7:29:30 AM   Radiology DG Ankle Complete Left  Result Date: 07/17/2019 CLINICAL DATA:  Left ankle pain after fall. EXAM: LEFT ANKLE COMPLETE - 3+ VIEW COMPARISON:  None. FINDINGS: Acute minimally displaced oblique fracture of the distal fibular metaphysis. No additional fracture. No dislocation. The ankle mortise is symmetric. The talar dome is intact. No tibiotalar joint effusion. Joint spaces are preserved. Bone mineralization is normal. Plantar and Achilles enthesophytes. Mild soft tissue swelling over the lateral malleolus.  IMPRESSION: 1. Acute minimally displaced fracture of the distal fibular metaphysis. Electronically Signed   By: Obie Dredge M.D.   On: 07/17/2019 08:30    Procedures Procedures (including critical care time)  Medications Ordered in ED Medications  0.9 %  sodium chloride infusion ( Intravenous New Bag/Given 07/17/19 0751)    ED Course  I have reviewed the triage vital signs and the nursing notes.  Pertinent labs & imaging results that were available during my care of the patient were reviewed by me and considered in my medical decision making (see chart for details).    MDM Rules/Calculators/A&P                      Patient does have a left distal fibula nondisplaced fracture. Discussed with Dr. Dion Saucier was initially thinking surgery but then they had conversation with his foot and ankle specialist within the group. Recommending no surgery recommending cam walker boot. In therapy patient would be able to go home. Feel a walker would be helpful. We will have social worker work on getting a walker.   Electrolytes significant for a glucose of 170 sodium of 134. But nothing significant enough to require medical admission.  Patient still needs to be stood up from the events that happened earlier today. Labs without any significant abnormalities other than some anemia. But not severe enough to require transfusion.  After patient fitted with the Cam walking boot will get her up and make sure she does not have any orthostatic symptoms.  Patient's been completely asymptomatic here regarding the near syncopal episode. No tachycardia oxygen sats are good blood pressures very good. Patient without any further vertigo here. 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 They have not been able to come up with a solution. And patient does not do well on Antivert.  Patient ambulated fine.  With walker.  Patient stable for discharge home follow-up  with orthopedics.  No further dizziness  symptoms.  Patient is not a candidate for MRI because she had an aneurysm in the past and had some metal placed in her head a long time ago and therefore they are not certain whether she can have MRIs.  But clinically I do not feel that there was any stroke occurred today.  She has had the vertigo at times on and off for a while now.  Final Clinical Impression(s) / ED Diagnoses Final diagnoses:  Near syncope  Closed fracture of distal end of left fibula, unspecified fracture morphology, initial encounter    Rx / DC Orders ED Discharge Orders    None       Vanetta Mulders, MD 07/17/19 1017    Vanetta Mulders, MD 07/17/19 1020    Vanetta Mulders, MD 07/17/19 1043    Vanetta Mulders, MD 07/17/19 1324

## 2019-07-17 NOTE — ED Notes (Signed)
Pt ambulated self efficiently to restroom with the use of a walker. Pt returned safely to bedside. 

## 2019-07-17 NOTE — Discharge Instructions (Addendum)
Keep the cam walker on at all times.  Make an appointment to follow back up with orthopedics information provided above.  Make appointment follow-up with your doctor for the near syncopal episodes.  Return for any new or worse symptoms.  Take the pain medication as needed.  Also take the antinausea medicine as directed and as needed.  Patient does show some signs of anemia but not requiring blood transfusion.  Would recommend following up with your primary care doctor to have your hemoglobin checked again soon.

## 2019-07-17 NOTE — Progress Notes (Signed)
Orthopedic Tech Progress Note Patient Details:  Sarah Whitaker 1940-10-09 [id number]  Ortho Devices Type of Ortho Device: CAM walker Ortho Device/Splint Location: LLE Ortho Device/Splint Interventions: Ordered, Application   Post Interventions Patient Tolerated: Well Instructions Provided: Care of device   Sarah Whitaker 07/17/2019, 10:49 AM

## 2019-07-17 NOTE — ED Triage Notes (Signed)
Pt BIB [facility] from home. EMS originally called out for seizure. Upon EMS arrival pt had another episode of near syncope. Pt states she had dental work done yesterday. Pt also reports the same thing happened last time she had dental work done. Pt does have hx of vertigo. VSS. NAD.

## 2019-07-17 NOTE — Discharge Planning (Signed)
Anthoni Geerts, RN, BSN, NCM [phone] Pt qualifies for DME rolling walker.  DME  ordered through [facility].  Zack Blank of [facility] notified to deliver rolling walker to pt room prior to D/C home.  

## 2019-07-17 NOTE — ED Notes (Signed)
Pt discharge instructions reviewed. Pt verbalized understanding. Pt discharged. 

## 2019-07-17 NOTE — Progress Notes (Signed)
I was called by the emergency room provider, requested input regarding the ankle fracture.  She has a minimally displaced ankle fracture, and my first initial instinct was that she may need surgical intervention, however upon further discussion with some of our foot and ankle subspecialty colleagues, I am recommending a cam walker, weight-bear as tolerated, and close follow-up in the office to evaluate the integrity of the medial clear space.  As it stands right now it appears to be an SER-2 type fracture which should be okay to weight-bear, and we will reevaluate in the office in 1 week.  Full consult to follow with Dale [place], PA-C.  I am not sure if she needs any further work-up for her near syncopal event/seizure episode, or what the medical determination is.  Eulas Post, MD

## 2019-09-25 ENCOUNTER — Telehealth: Payer: Self-pay | Admitting: Neurology

## 2019-09-25 NOTE — Telephone Encounter (Signed)
Whitaker, Sarah(daughter on [employer]) is asking if Dr Lucia Gaskins has been monitoring pt's IVIG levels, daughter is asking to be called

## 2019-09-25 NOTE — Telephone Encounter (Signed)
Patient has not followed up with [place], we saw her in 12/2018 and she was supposed to return in 3 months. Set her up with a follow up with [facility] or Amy

## 2019-09-29 NOTE — Telephone Encounter (Signed)
Called the daughter back. Advised that from our standpoint we were suppose to have had a  Follow up visit 3 mths after the October visit. Informed her that we need to schedule apt.  The daughter states that she is scheduled for her next treatment in home on Aug 16,2021. The nurse that comes out has stated that labs are suppose to be drawn monthly, but daughter does not recall that being done. They wanted to advise that they can get the lab drawn when they go to her home on 10/20/19 but we would have to give them orders to draw the labs. Pt is scheduled for first available apt 11/04/2019. Pt daughter also indicated that she is off the week of Aug 9th if something opens up during that week.  Advised I would send this information to Dr Lucia Gaskins to see what orders would need to be ordered and if she is ok with [facility] nurse drawing the labs on next infusion date. Pt daughter asked we called back to advise if was completed.

## 2019-09-29 NOTE — Telephone Encounter (Signed)
Attempted to call person below to help assist with how labs should be ordered. Whether I fax them orders written or if needs to be entered in computer and print the order requisition to fax to them.   Sarajane Marek  [facility] IG Account Manager, [facility] [address] [place], [place] [address]        M    [phone]        F     [phone]  LVM and will wait to hear back

## 2019-09-29 NOTE — Telephone Encounter (Signed)
Called the daughter back to advise that Dr Lucia Gaskins is ok with providing the patient with orders for blood work to be completed. There was no answer. LVM asking if they have a contact for the specific nurse that comes out to the house.   ** If daughter calls back I need the contact person, number and fax if available also. This is so I can call and give verbal or get guidance on how to send the written order for the patient to have lab work completed on her aug 16 infusion.

## 2019-09-29 NOTE — Telephone Encounter (Signed)
CBC with diff, CMP thanks

## 2019-10-01 ENCOUNTER — Other Ambulatory Visit: Payer: Self-pay | Admitting: Neurology

## 2019-10-01 DIAGNOSIS — D8989 Other specified disorders involving the immune mechanism, not elsewhere classified: Secondary | ICD-10-CM

## 2019-10-01 DIAGNOSIS — Z79899 Other long term (current) drug therapy: Secondary | ICD-10-CM

## 2019-10-01 DIAGNOSIS — G908 Other disorders of autonomic nervous system: Secondary | ICD-10-CM

## 2019-10-01 DIAGNOSIS — G9089 Other disorders of autonomic nervous system: Secondary | ICD-10-CM

## 2019-10-01 DIAGNOSIS — M0609 Rheumatoid arthritis without rheumatoid factor, multiple sites: Secondary | ICD-10-CM

## 2019-10-01 NOTE — Telephone Encounter (Signed)
I Had not heard anything from either person so I went ahead and placed orders for the patient in epic. Printed order req forms and sent to [facility] fax number that was mentioned below. If there are any problems they can contact the office.

## 2019-10-07 NOTE — Telephone Encounter (Signed)
Daughter called back with the fax number where the order needs to be faxed to. She states the order for [facility] needs to be faxed to [phone] attention Tamara.

## 2019-10-07 NOTE — Telephone Encounter (Signed)
I faxed the lab orders to [facility] @ [phone] attention Tamara. Received a receipt of confirmation.

## 2019-10-13 NOTE — Telephone Encounter (Signed)
Pt's daughter called and LVM stating that her blood test order was sent to [facility] for the infusion treatment but they did not receive the IVIG request. She would like to know if this can be faxed where the blood work order was faxed. Please advise.

## 2019-10-13 NOTE — Telephone Encounter (Signed)
I did not see where we needed to send more IVIG orders. I have called Porfirio Mylar w/ [facility] and LVM asking for call back.

## 2019-10-14 NOTE — Telephone Encounter (Signed)
I called [facility] and spoke with the receptionist. She stated they have the patient as active through September and do not see where anything is needed right now. She stated the pharmacist would reach out if any information is needed.

## 2019-10-14 NOTE — Telephone Encounter (Signed)
Carmen @ [facility] called me back. She will see if orders are needed and will be in touch (if needed).

## 2019-10-16 NOTE — Telephone Encounter (Signed)
Received orders for IVIG Gammagard 180 gm IV over 5 days every 4 weeks for 3 cycles. Refills for 1 year unless otherwise noted. Orders signed by Dr Lucia Gaskins and were faxed back to [facility]. Received a receipt of confirmation.

## 2019-10-17 ENCOUNTER — Other Ambulatory Visit: Payer: Self-pay | Admitting: Family Medicine

## 2019-10-17 DIAGNOSIS — Z780 Asymptomatic menopausal state: Secondary | ICD-10-CM

## 2019-10-20 ENCOUNTER — Ambulatory Visit: Payer: Medicare Other | Admitting: Adult Health

## 2019-10-20 NOTE — Telephone Encounter (Signed)
Daughter Joni Reining on [place]) has called to report the order sent was wrong, an order is needed for pt's IGG test.  Daughter states it needs to be sent to [facility] fax [phone]

## 2019-10-20 NOTE — Telephone Encounter (Signed)
Patient has already been receiving ivig so no need to check for iga that is something that is checked prior to even starting

## 2019-10-20 NOTE — Telephone Encounter (Signed)
I did not see where this order was needed. Last labs ordered were CBC w diff and CMP. I called the daughter back. She said the [facility] nurse told her this was usually ordered. I let her know I was unaware of any need for this but would confirm with Dr Lucia Gaskins. She verbalized appreciation.

## 2019-10-20 NOTE — Telephone Encounter (Signed)
I called the pt's daughter back and let her know this additional IG order is not needed. It is checked prior to IVIG and the pt has already been receiving IVIG even prior to seeing Dr Lucia Gaskins. She verbalized understanding and appreciation.

## 2019-11-04 ENCOUNTER — Encounter: Payer: Self-pay | Admitting: Family Medicine

## 2019-11-04 ENCOUNTER — Telehealth (INDEPENDENT_AMBULATORY_CARE_PROVIDER_SITE_OTHER): Payer: Medicare Other | Admitting: Family Medicine

## 2019-11-04 DIAGNOSIS — G629 Polyneuropathy, unspecified: Secondary | ICD-10-CM

## 2019-11-04 DIAGNOSIS — R29898 Other symptoms and signs involving the musculoskeletal system: Secondary | ICD-10-CM

## 2019-11-04 NOTE — Progress Notes (Addendum)
PATIENT: Sarah Whitaker DOB: 1940/03/13  REASON FOR VISIT: follow up HISTORY FROM: patient  Virtual Visit via Telephone Note  I connected with Rashawna Sagraves on 11/04/19 at 10:30 AM EDT by telephone and verified that I am speaking with the correct person using two identifiers.   I discussed the limitations, risks, security and privacy concerns of performing an evaluation and management service by telephone and the availability of in person appointments. I also discussed with the patient that there may be a patient responsible charge related to this service. The patient expressed understanding and agreed to proceed.   History of Present Illness:  11/04/19 Sarah Whitaker is a 79 y.o. 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 She continues therapy every 4 weeks for 5 days. She did get a port and is doing well with this. She feels that she is doing very well.  She did have a fall resulting in a foot fracture in 07/2019. She reports very tired after a trip to [place] for her son's funeral. She had a syncopal episode and fell. She has not had any other syncopal episodes. No other falls. She has been followed by PCP closely. She was not able to exercise as much while wearing a boot but is now building up her endurance. She was walking about 20 minutes daily prior to fall. She feels her legs are weaker since she has been more sedentary. She is walking for about 5 minutes twice a day.    Observations/Objective:  Generalized: Well developed, in no acute distress  Mentation: Alert oriented to time, place, history taking. Follows all commands speech and language fluent Motor: moves all extremities freely. Unable to assess strength, however, patient is able to stand unassisted and reports ambulating with no assistive device.    Assessment and Plan:  79 y.o. year old female  has a past medical history of Anxiety, Depression, Headache, Hyperlipidemia, Hypertension, Neuropathy, Rheumatoid  aortitis, and Vertigo. here with    ICD-10-CM   1. Polyneuropathy  G62.9 Ambulatory referral to Physical Therapy  2. Weakness of both lower extremities  R29.898 Ambulatory referral to Physical Therapy   Sunday is doing very well on IVIG for management of polyneuropathy. We will continue current treatment plan. She is working to build her endurance following several months of limited mobility due to fractured foot. Fortunately, she has not had any other falls or syncopal events. Fall not related to polyneuropathy, however, she feels that lower extremity strength is weaker since fall. I have recommended PT for strengthen and gait eval. She is in agreement. Orders placed today. She will continue close follow up with PCP as directed. She was encouraged to continue healthy lifestyle habits. She will return to see [place] in 6 months. She verbalizes understanding and agreement with this plan.     Orders Placed This Encounter  Procedures  . Ambulatory referral to Physical Therapy    Referral Priority:   Routine    Referral Type:   Physical Medicine    Referral Reason:   Specialty Services Required    Requested Specialty:   Physical Therapy    Number of Visits Requested:   1    No orders of the defined types were placed in this encounter.    Follow Up Instructions:  I discussed the assessment and treatment plan with the patient. The patient was provided an opportunity to ask questions and all were answered. The patient agreed with the plan and demonstrated an understanding of the  instructions.   The patient was advised to call back or seek an in-person evaluation if the symptoms worsen or if the condition fails to improve as anticipated.  I provided 20 minutes of non-face-to-face time during this encounter. Patient is located at her place of residence during Mychart visit. Provider is in the office.    Shawnie Dapper, NP   Made any corrections needed, and agree with history, physical, neuro  exam,assessment and plan as stated.     Naomie Dean, MD [facility]

## 2020-01-26 ENCOUNTER — Telehealth: Payer: Self-pay | Admitting: *Deleted

## 2020-01-26 NOTE — Telephone Encounter (Signed)
Received home health certification and plan of care forms for IVIG (180 grams divided over 5 days) from [employer]. MD signed forms. They were faxed back to [facility]. Received a receipt of confirmation.

## 2020-01-27 ENCOUNTER — Other Ambulatory Visit: Payer: Medicare Other

## 2020-02-05 NOTE — Telephone Encounter (Signed)
Received request for last office notes from [facility] to obtain PA for ordered services. NP's office note faxed to [facility], received confirmation.

## 2020-02-13 ENCOUNTER — Encounter: Payer: Self-pay | Admitting: Physical Therapy

## 2020-02-13 ENCOUNTER — Other Ambulatory Visit: Payer: Self-pay | Admitting: Cardiothoracic Surgery

## 2020-02-13 ENCOUNTER — Ambulatory Visit: Payer: Medicare Other | Attending: Family Medicine | Admitting: Physical Therapy

## 2020-02-13 ENCOUNTER — Other Ambulatory Visit: Payer: Self-pay

## 2020-02-13 DIAGNOSIS — R2689 Other abnormalities of gait and mobility: Secondary | ICD-10-CM | POA: Insufficient documentation

## 2020-02-13 DIAGNOSIS — M6281 Muscle weakness (generalized): Secondary | ICD-10-CM | POA: Insufficient documentation

## 2020-02-13 DIAGNOSIS — R2681 Unsteadiness on feet: Secondary | ICD-10-CM | POA: Diagnosis present

## 2020-02-13 DIAGNOSIS — R911 Solitary pulmonary nodule: Secondary | ICD-10-CM

## 2020-02-13 DIAGNOSIS — R262 Difficulty in walking, not elsewhere classified: Secondary | ICD-10-CM | POA: Insufficient documentation

## 2020-02-13 NOTE — Therapy (Signed)
[facility] [address] [place], [place], [address] Phone: [phone]   Fax:  [phone]  Physical Therapy Evaluation  Patient Details  Name: Sarah Whitaker MRN: [id number] Date of Birth: Sarah 09, 1942 Referring Provider (PT): Shawnie Dapper, NP   Encounter Date: 02/13/2020    Past Medical History:  Diagnosis Date  . Anxiety   . Depression   . Headache   . Hyperlipidemia   . Hypertension   . Neuropathy   . Rheumatoid aortitis   . Vertigo     Past Surgical History:  Procedure Laterality Date  . APPENDECTOMY    . BRAIN SURGERY     Repair of an aneurysm  . IR IMAGING GUIDED PORT INSERTION  01/01/2019  . SHOULDER SURGERY Right     There were no vitals filed for this visit.    Subjective Assessment - 02/13/20 1407    Subjective Pt states she needs to build her stamina. Pt states her neuropathy and arthritis together have weakened her. Pt states she had HHPT because she fractured her L ankle in May 2021 (boot for 3 months -- nonsurgical). Pt states she walks some but not enough (walks in hall). Pt states her vertigo and balance have been improved.    How long can you sit comfortably? n/a    How long can you stand comfortably? n/a    How long can you walk comfortably? 7-8 minutes    Patient Stated Goals Work on stamina    Currently in Pain? No/denies              [facility] PT Assessment - 02/13/20 0001      Assessment   Medical Diagnosis G62.9 (ICD-10-CM) - Polyneuropathy; R29.898 (ICD-10-CM) - Weakness of both lower extremities    Referring Provider (PT) Lomax, Amy, NP    Hand Dominance Right    Prior Therapy 11/21/18 for vertigo/balance      Precautions   Precautions Fall      Restrictions   Weight Bearing Restrictions No      Balance Screen   Has the patient fallen in the past 6 months No   not since ankle fracture   Has the patient had a decrease in activity level because of a fear of falling?  No     Is the patient reluctant to leave their home because of a fear of falling?  No      Home Nurse, mental health Private residence    Living Arrangements Alone    Available Help at Discharge Family   daughter stays 2 days/wk   Type of Home Apartment    Home Access Level entry    Home Layout One level    Home Equipment [place] - single point      Prior Function   Level of Independence Independent   needs assist with driving, meals on wheels   Leisure watch TV      Strength   Right Hip Flexion 4/5    Right Hip External Rotation  4/5    Right Hip Internal Rotation 4/5    Right Hip ABduction 4+/5    Left Hip Flexion 4+/5    Left Hip External Rotation 4/5    Left Hip Internal Rotation 4/5    Left Hip ABduction 4+/5    Right Knee Flexion 4+/5    Right Knee Extension 4+/5    Left Knee Flexion 5/5    Left Knee Extension 5/5    Right Ankle Dorsiflexion  3+/5    Right Ankle Plantar Flexion 4/5    Left Ankle Dorsiflexion 3+/5    Left Ankle Plantar Flexion 4/5      Transfers   Transfers Sit to Stand;Stand to Sit    Sit to Stand 5: Supervision    Sit to Stand Details (indicate cue type and reason) Pt utilizes very widened BOS, needs to push through UEs on knees or chair    Stand to Sit 5: Supervision    Stand to Sit Details Uncontrolled descent with fatigue; utilizes wide BOS      Ambulation/Gait   Ambulation Distance (Feet) 330 Feet    Assistive device None    Gait Pattern Shuffle;Wide base of support;Abducted - left;Abducted- right;Poor foot clearance - left;Poor foot clearance - right;Right foot flat;Left foot flat;Decreased dorsiflexion - right;Decreased dorsiflexion - left;Decreased stride length;Step-through pattern    Ambulation Surface Level;Indoor    Gait velocity 1.6 ft/sec      6 Minute Walk- Baseline   Modified Borg Scale for Dyspnea 0- Nothing at all      6 Minute walk- Post Test   Modified Borg Scale for Dyspnea 4- somewhat severe      6 minute walk test  results    Aerobic Endurance Distance Walked 371    Endurance additional comments 1 sitting rest break at 350'      Standardized Balance Assessment   Standardized Balance Assessment Five Times Sit to Stand;Timed Up and Go Test    Five times sit to stand comments  21.72      Timed Up and Go Test   TUG Normal TUG    Normal TUG (seconds) 15.03                      Objective measurements completed on examination: See above findings.                 PT Short Term Goals - 02/13/20 1457      PT SHORT TERM GOAL #1   Title Pt will be independent with initial HEP    Time 3    Period Weeks    Status New    Target Date 03/05/20      PT SHORT TERM GOAL #2   Title Pt will improve 6 MWT by at least 100 ft to demo improved endurance with no rest break    Baseline 471 ft on 6 MWT    Time 3    Period Weeks    Status New    Target Date 03/05/20      PT SHORT TERM GOAL #3   Title Pt will be able to demo improved heel strike bilaterally during gait without cueing for increased gait efficiency    Baseline Amb with foot flat and decreased ankle DF    Time 3    Period Weeks    Status New    Target Date 03/05/20             PT Long Term Goals - 02/13/20 1502      PT LONG TERM GOAL #1   Title Pt will be independent with advanced HEP    Time 6    Period Weeks    Status New    Target Date 03/26/20      PT LONG TERM GOAL #2   Title Pt will be able to walk to and from her apartment mailbox without any rest breaks    Baseline Requires 1 rest break  currently    Time 6    Period Weeks    Status New    Target Date 03/26/20      PT LONG TERM GOAL #3   Title Pt will have improved 5x STS to </=15 sec to demo improved functional strength    Baseline 21.72 sec    Time 6    Period Weeks    Status New    Target Date 03/26/20      PT LONG TERM GOAL #4   Title Pt will improve TUG score to </=12 sec to demo decreased fall risk    Baseline 15.01 sec    Time 6     Period Weeks    Status New    Target Date 03/26/20      PT LONG TERM GOAL #5   Title Pt will be able to improve 6 MWT by at least 190 ft (~661 ft) to demo minimal detectable change    Baseline 371 ft    Time 6    Period Weeks    Status New    Target Date 03/26/20                  Plan - 02/13/20 1448    Clinical Impression Statement Pt is a 79 y/o F presenting to OPPT due to complaints of reduced stamina and weakness. Pt with history of Anxiety, Depression, Headache, Hyperlipidemia, Hypertension, Neuropathy, Rheumatoid aortitis, and Vertigo. Pt with subacute L ankle fracture in May of 2021 treated nonsurgically -- pt is clear of any precautions and no longer wearing a boot. On assessment, pt is found to have decreased endurance/activity tolerance as seen with her 6 MWT, decreased functional strength in 5xSTS, and increased fall risk based on her TUG score. Pt would benefit from PT to address these issues to optimize her current level of function.    Personal Factors and Comorbidities Age;Comorbidity 1    Comorbidities L ankle fracture May 2021, Anxiety, Depression, Headache, Hyperlipidemia, Hypertension, Neuropathy, Rheumatoid aortitis, and Vertigo    Examination-Activity Limitations Locomotion Level;Stairs;Transfers    Examination-Participation Restrictions Community Activity;Shop    Stability/Clinical Decision Making Stable/Uncomplicated    Clinical Decision Making Low    Rehab Potential Good    PT Frequency 1x / week    PT Duration 6 weeks    PT Treatment/Interventions ADLs/Self Care Home Management;Cryotherapy;Electrical Stimulation;Moist Heat;Ultrasound;Gait training;Stair training;Functional mobility training;Therapeutic activities;Therapeutic exercise;Balance training;Neuromuscular re-education;Patient/family education;Manual techniques;Energy conservation;Taping;Vestibular    PT Next Visit Plan Initiate strengthening exercises. Work on endurance (walking or consider  Sci-Fit or Nu-step). Practice step exercises. Work on gait.    Consulted and Agree with Plan of Care Patient           Patient will benefit from skilled therapeutic intervention in order to improve the following deficits and impairments:  Abnormal gait,Difficulty walking,Decreased endurance,Decreased balance,Decreased activity tolerance,Decreased mobility,Decreased strength  Visit Diagnosis: Difficulty in walking, not elsewhere classified  Other abnormalities of gait and mobility  Muscle weakness (generalized)  Unsteadiness on feet     Problem List Patient Active Problem List   Diagnosis Date Noted  . Murmur 04/01/2019  . SOB (shortness of breath) 04/01/2019  . Educated about COVID-19 virus infection 04/01/2019  . Polyneuropathy 12/16/2018  . 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 Obesity (BMI 30-39.9) 12/16/2018    Sarah Whitaker Sarah Whitaker PT, DPT 02/13/2020, 3:05 PM  [facility] [address] [place], [place], [address] Phone: [phone]  Fax:  [phone]  Name: Sarah Whitaker MRN: [id number] Date of Birth: 05-31-1940

## 2020-02-25 ENCOUNTER — Ambulatory Visit: Payer: Medicare Other | Admitting: Physical Therapy

## 2020-03-04 ENCOUNTER — Ambulatory Visit: Payer: Medicare Other | Admitting: Physical Therapy

## 2020-03-10 ENCOUNTER — Ambulatory Visit: Payer: Medicare Other | Admitting: Cardiothoracic Surgery

## 2020-03-10 ENCOUNTER — Other Ambulatory Visit: Payer: Medicare Other

## 2020-03-10 DIAGNOSIS — G6181 Chronic inflammatory demyelinating polyneuritis: Secondary | ICD-10-CM | POA: Diagnosis not present

## 2020-03-12 ENCOUNTER — Ambulatory Visit: Payer: Medicare Other | Admitting: Physical Therapy

## 2020-03-15 DIAGNOSIS — G6181 Chronic inflammatory demyelinating polyneuritis: Secondary | ICD-10-CM | POA: Diagnosis not present

## 2020-03-16 DIAGNOSIS — G6181 Chronic inflammatory demyelinating polyneuritis: Secondary | ICD-10-CM | POA: Diagnosis not present

## 2020-03-17 DIAGNOSIS — G6181 Chronic inflammatory demyelinating polyneuritis: Secondary | ICD-10-CM | POA: Diagnosis not present

## 2020-03-18 DIAGNOSIS — G6181 Chronic inflammatory demyelinating polyneuritis: Secondary | ICD-10-CM | POA: Diagnosis not present

## 2020-03-19 ENCOUNTER — Ambulatory Visit: Payer: Medicare Other | Admitting: Physical Therapy

## 2020-03-19 DIAGNOSIS — G6181 Chronic inflammatory demyelinating polyneuritis: Secondary | ICD-10-CM | POA: Diagnosis not present

## 2020-03-25 ENCOUNTER — Ambulatory Visit: Payer: Medicare Other | Admitting: Physical Therapy

## 2020-03-26 DIAGNOSIS — E782 Mixed hyperlipidemia: Secondary | ICD-10-CM | POA: Diagnosis not present

## 2020-03-26 DIAGNOSIS — M0579 Rheumatoid arthritis with rheumatoid factor of multiple sites without organ or systems involvement: Secondary | ICD-10-CM | POA: Diagnosis not present

## 2020-03-26 DIAGNOSIS — I10 Essential (primary) hypertension: Secondary | ICD-10-CM | POA: Diagnosis not present

## 2020-03-26 DIAGNOSIS — E118 Type 2 diabetes mellitus with unspecified complications: Secondary | ICD-10-CM | POA: Diagnosis not present

## 2020-03-26 DIAGNOSIS — E7800 Pure hypercholesterolemia, unspecified: Secondary | ICD-10-CM | POA: Diagnosis not present

## 2020-03-31 DIAGNOSIS — M0579 Rheumatoid arthritis with rheumatoid factor of multiple sites without organ or systems involvement: Secondary | ICD-10-CM | POA: Diagnosis not present

## 2020-03-31 DIAGNOSIS — R0981 Nasal congestion: Secondary | ICD-10-CM | POA: Diagnosis not present

## 2020-03-31 DIAGNOSIS — R519 Headache, unspecified: Secondary | ICD-10-CM | POA: Diagnosis not present

## 2020-04-01 DIAGNOSIS — Z1159 Encounter for screening for other viral diseases: Secondary | ICD-10-CM | POA: Diagnosis not present

## 2020-04-02 ENCOUNTER — Other Ambulatory Visit: Payer: Medicare Other

## 2020-04-02 ENCOUNTER — Ambulatory Visit: Payer: Medicare Other | Admitting: Thoracic Surgery (Cardiothoracic Vascular Surgery)

## 2020-04-07 DIAGNOSIS — Z79899 Other long term (current) drug therapy: Secondary | ICD-10-CM | POA: Diagnosis not present

## 2020-04-12 DIAGNOSIS — G6181 Chronic inflammatory demyelinating polyneuritis: Secondary | ICD-10-CM | POA: Diagnosis not present

## 2020-04-13 DIAGNOSIS — G629 Polyneuropathy, unspecified: Secondary | ICD-10-CM | POA: Diagnosis not present

## 2020-04-14 ENCOUNTER — Telehealth: Payer: Self-pay | Admitting: *Deleted

## 2020-04-14 DIAGNOSIS — G629 Polyneuropathy, unspecified: Secondary | ICD-10-CM | POA: Diagnosis not present

## 2020-04-14 NOTE — Telephone Encounter (Signed)
Spoke with [employer]. [facility] is requesting (per insurance) copy of latest office visit at [facility] and CSF test results (they will contact [facility] neurology for this). Also need new orders with clarified diagnosis code.

## 2020-04-15 ENCOUNTER — Other Ambulatory Visit: Payer: Medicare Other

## 2020-04-15 ENCOUNTER — Encounter: Payer: Self-pay | Admitting: *Deleted

## 2020-04-15 DIAGNOSIS — G629 Polyneuropathy, unspecified: Secondary | ICD-10-CM | POA: Diagnosis not present

## 2020-04-15 NOTE — Telephone Encounter (Addendum)
Spoke with the patient (confirmed name and DOB). Her medications and allergies were updated. There is no documented history of any kidney disease or Diabetes. I asked the pt and she stated she has a history of Diabetes but was told she no longer had to take any medications due to her A1C being lowered and is "really good". Patient also stated she feels her symptoms are stable on her current dose of IVIG. She feels she has leveled off and is no worse. Pt provided weight update. However I have spoken with Dr Lucia Gaskins and she advised if patient stable on her current dose we would not change it. Pt currently on 180 gm divided over 5 days ever 4 weeks.

## 2020-04-15 NOTE — Telephone Encounter (Signed)
Most recent office note, orders, insurance information, updated medication list all faxed to [facility]. Also noted pt's h/o diabetes however she no longer requires medication due to lowered A1C. Received a receipt of confirmation.

## 2020-04-15 NOTE — Addendum Note (Signed)
Addended by: Bertram Savin on: 04/15/2020 04:31 PM   Modules accepted: Orders

## 2020-04-17 DIAGNOSIS — G629 Polyneuropathy, unspecified: Secondary | ICD-10-CM | POA: Diagnosis not present

## 2020-04-18 DIAGNOSIS — G629 Polyneuropathy, unspecified: Secondary | ICD-10-CM | POA: Diagnosis not present

## 2020-04-20 DIAGNOSIS — E118 Type 2 diabetes mellitus with unspecified complications: Secondary | ICD-10-CM | POA: Diagnosis not present

## 2020-04-20 DIAGNOSIS — I10 Essential (primary) hypertension: Secondary | ICD-10-CM | POA: Diagnosis not present

## 2020-04-20 DIAGNOSIS — E7800 Pure hypercholesterolemia, unspecified: Secondary | ICD-10-CM | POA: Diagnosis not present

## 2020-04-20 DIAGNOSIS — M0579 Rheumatoid arthritis with rheumatoid factor of multiple sites without organ or systems involvement: Secondary | ICD-10-CM | POA: Diagnosis not present

## 2020-04-20 DIAGNOSIS — E782 Mixed hyperlipidemia: Secondary | ICD-10-CM | POA: Diagnosis not present

## 2020-04-23 ENCOUNTER — Ambulatory Visit: Payer: Medicare Other | Admitting: Thoracic Surgery (Cardiothoracic Vascular Surgery)

## 2020-04-23 ENCOUNTER — Ambulatory Visit
Admission: RE | Admit: 2020-04-23 | Discharge: 2020-04-23 | Disposition: A | Discharge status: Home / Self Care | Payer: Medicare Other | Source: Ambulatory Visit | Attending: Cardiothoracic Surgery | Admitting: Cardiothoracic Surgery

## 2020-04-23 ENCOUNTER — Encounter: Payer: Self-pay | Admitting: Thoracic Surgery (Cardiothoracic Vascular Surgery)

## 2020-04-23 ENCOUNTER — Other Ambulatory Visit: Payer: Self-pay

## 2020-04-23 VITALS — BP 122/73 | HR 95 | Resp 20 | Ht 62.0 in

## 2020-04-23 DIAGNOSIS — R911 Solitary pulmonary nodule: Secondary | ICD-10-CM | POA: Diagnosis not present

## 2020-04-23 DIAGNOSIS — I2510 Atherosclerotic heart disease of native coronary artery without angina pectoris: Secondary | ICD-10-CM | POA: Diagnosis not present

## 2020-04-23 DIAGNOSIS — I700 Atherosclerosis of aorta: Secondary | ICD-10-CM | POA: Diagnosis not present

## 2020-04-23 DIAGNOSIS — J9859 Other diseases of mediastinum, not elsewhere classified: Secondary | ICD-10-CM | POA: Diagnosis not present

## 2020-04-23 DIAGNOSIS — I898 Other specified noninfective disorders of lymphatic vessels and lymph nodes: Secondary | ICD-10-CM | POA: Diagnosis not present

## 2020-04-23 MED ORDER — IOPAMIDOL (ISOVUE-300) INJECTION 61%
75.0000 mL | Freq: Once | INTRAVENOUS | Status: AC | PRN
  Administered 2020-04-23: 75 mL via INTRAVENOUS

## 2020-04-23 NOTE — Progress Notes (Signed)
[address]       [place] [address]             [phone]                    Sarah Whitaker [facility] Medical Record #[id number] Date of Birth: 1941-01-01  Referring: Aliene Beams, MD Primary Care: Aliene Beams, MD Primary Cardiologist: No primary care provider on file.  Chief Complaint:    Chief Complaint  Patient presents with  . 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  She was previously followed by Dr. Maren Beach when this was discovered incidentally.  She was also diagnosed with a polyneuropathy around that same time and was being treated with IVIG.  Since then her symptoms have somewhat improved but she still remains limited in her mobility.  She moved out to [facility] to be closer to her daughter for assistance with her care.    Past Medical History:  Diagnosis Date  . Anxiety   . Depression   . Headache   . Hx of diabetes mellitus    pt states she was told she didn't have to take anything anymore because of her A1C.  Marland Kitchen Hyperlipidemia   . Hypertension   . Neuropathy   . Rheumatoid aortitis   . Vertigo     Past Surgical History:  Procedure Laterality Date  . APPENDECTOMY    . BRAIN SURGERY     Repair of an aneurysm  . IR IMAGING GUIDED PORT INSERTION  01/01/2019  . SHOULDER SURGERY Right     Family History  Problem Relation Age of Onset  . Aneurysm Mother        Brain  . Heart disease Father 41  . Aneurysm Sister        Brain  . Neuromuscular disorder Neg Hx      Social History   Tobacco Use  Smoking Status Former Smoker  Smokeless Tobacco Never Used    Social History   Substance and Sexual Activity  Alcohol Use Never     Allergies  Allergen Reactions  . Penicillins     Did it involve swelling of the face/tongue/throat, SOB, or low BP? No Did it involve sudden or severe  rash/hives, skin peeling, or any reaction on the inside of your mouth or nose? Yes Did you need to seek medical attention at a hospital or doctor's office? Yes When did it last happen?in her 59s If all above answers are "NO", may proceed with cephalosporin use.   . Red Dye Itching  . Latex Rash  . Sulfa Antibiotics Rash    Blisters  . Tetanus Toxoids Rash    Current Outpatient Medications  Medication Sig Dispense Refill  . Ascorbic Acid (VITAMIN C PO) Take 1,060 mg by mouth daily.    . ASPIRIN 81 PO Take 81 mg by mouth daily.    Marland Kitchen atorvastatin (LIPITOR) 20 MG tablet Take 20 mg by mouth daily.    Marland Kitchen b complex vitamins tablet Take 2 tablets by mouth daily.     . calcium carbonate (TUMS - DOSED IN MG ELEMENTAL CALCIUM) 500 MG chewable tablet Chew 1 tablet by mouth as needed.    . Cholecalciferol (VITAMIN D3) 250 MCG (10000 UT) TABS Take by mouth.    . folic  acid (FOLVITE) 1 MG tablet Take 1 mg by mouth daily.    . Immune Globulin, Human, (GAMMAGARD IV) Inject into the vein.    Marland Kitchen lidocaine (LMX) 4 % cream Apply 1 application topically as needed. Once monthly for port    . lisinopril-hydrochlorothiazide (ZESTORETIC) 20-25 MG tablet Take 0.5 tablets by mouth daily.    . methotrexate (RHEUMATREX) 2.5 MG tablet Take 15 mg by mouth once a week. Caution:Chemotherapy. Protect from light. Take 5 tabs weekly; Fridays    . Multiple Vitamin (MULTIVITAMIN ADULT PO) Take 1 tablet by mouth daily. "Living Green" tablet    . ondansetron (ZOFRAN ODT) 4 MG disintegrating tablet Take 1 tablet (4 mg total) by mouth every 8 (eight) hours as needed for nausea or vomiting. 12 tablet 1   No current facility-administered medications for this visit.    Review of Systems  Constitutional: Positive for malaise/fatigue.  Respiratory: Positive for shortness of breath.   Cardiovascular: Negative for chest pain.  Neurological: Positive for weakness.    PHYSICAL EXAMINATION: BP 122/73 (BP Location: Left Arm,  Patient Position: Sitting)   Pulse 95   Resp 20   Ht 5\' 2"  (1.575 m)   SpO2 98% Comment: RA  BMI 33.29 kg/m   Physical Exam Constitutional:      Appearance: She is obese. She is not ill-appearing.     Comments: Presents in a wheelchair.  Uses a cane for ambulation.  Eyes:     Extraocular Movements: Extraocular movements intact.  Cardiovascular:     Rate and Rhythm: Normal rate.  Pulmonary:     Effort: Pulmonary effort is normal. No respiratory distress.  Abdominal:     General: There is no distension.  Neurological:     General: No focal deficit present.     Mental Status: She is alert and oriented to person, place, and time.      Diagnostic Studies & Laboratory data:     Recent Radiology Findings:   No results found.     I have independently reviewed the above radiology studies  and reviewed the findings with the patient.   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  She denies any symptoms, but was diagnosed with a polyneuropathy and is being followed by the neurology service and treated with IVIG.  I personally reviewed the cross-sectional imaging, at the cyst appears unchanged.  Given her comorbidities I do not think that surgical intervention is warranted.  She states that she would like to follow-up in 2 years with a repeat scan.      62 04/23/2020 3:46 PM

## 2020-05-06 ENCOUNTER — Encounter: Payer: Self-pay | Admitting: Neurology

## 2020-05-06 ENCOUNTER — Ambulatory Visit: Payer: Medicare Other | Admitting: Neurology

## 2020-05-06 ENCOUNTER — Other Ambulatory Visit: Payer: Self-pay

## 2020-05-06 VITALS — BP 124/56 | HR 79 | Ht 62.5 in | Wt 187.0 lb

## 2020-05-06 DIAGNOSIS — R21 Rash and other nonspecific skin eruption: Secondary | ICD-10-CM

## 2020-05-06 DIAGNOSIS — Z79899 Other long term (current) drug therapy: Secondary | ICD-10-CM

## 2020-05-06 DIAGNOSIS — R269 Unspecified abnormalities of gait and mobility: Secondary | ICD-10-CM | POA: Diagnosis not present

## 2020-05-06 DIAGNOSIS — R2689 Other abnormalities of gait and mobility: Secondary | ICD-10-CM

## 2020-05-06 DIAGNOSIS — G629 Polyneuropathy, unspecified: Secondary | ICD-10-CM | POA: Diagnosis not present

## 2020-05-06 MED ORDER — PREDNISONE 20 MG PO TABS: 20.0000 mg | ORAL_TABLET | Freq: Every day | ORAL | 0 refills | Status: DC

## 2020-05-06 NOTE — Progress Notes (Signed)
[name]    Provider:  Dr Jaynee Eagles Requesting Provider: Caren Macadam, MD Primary Care Provider:  Caren Macadam, MD  CC:  Neuropathy  05/06/2020: Patient is here with daughter. She still feels she needs IVIG. We discussed pushing the IVIG out to 5 weeks to see how she does nd then maybe 6 weeks if possible to get her off of the IVIG. Also she gets it over 5 days, try to reduce to 3 days to make it less cumbersome for her. She reports tingling int he fingers, may consider emg/ncs and could check one leg as well and the uppers. Home PT/OT to help with gait and strength.   Interval history May 06, 2020: Patient has been following in our office and having regular appointments with Debbora Presto, NP, today I'm seeing her today. She did get a port and is doing well with this. She has been followed by PCP closely. She has been more sedentary. But overall she has been doing very well for polyneuropathy. When last seen by Debbora Presto in August 2021 she had a fall but it was not related to her polyneuropathy and we recommended physical therapy for strength and gait evaluation.  Interval update 02/15/2019:  She is here for followup on neuropathy and IVIG: Her vertigo is better. She went to PT for vestibular therapy and the vertigo was better. The second time she went her pulse went up so she did not go back. Overall she continues to improve with neuropathy and vertigo, she feels a little weak after IVIG and it takes some time to feel better, it take her 3 days to get back to herself, bu tthen she feels great. She is a hard stick, since we will be continuing this we will insert a  port, it is terrible getting the injections per patient and she would like a port. No difference being off of the statin, restart. Feels better on iron. She worries about her memory, no family history Discussed normal cognitive aging. Will keep IVIG every month over 5 days, discussed increasing time in between IVIG  treatments from 4 to 5 weeks when she is read and also decreasing how many days it takes to get her IVIG from 5 to 3 but right now will leave it as is.     HPI:  Sarah Whitaker is a 80 y.o. female here as requested by Caren Macadam, MD for neuropathy.  Past medical history diabetes, rheumatoid arthritis, hyperlipidemia, hypertension. 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 She can walk now, not using the cane as much, She never had pain more weakness in the legs, started acutely confirmed by extensive workup by neurology in [facility]. She still feels weak but significantly improved. She never had sensory changes, all weakness, in the legs but not in the hands and did not affect the hands. Denies any numbness. Ankles stil stiff. Prior to acute symptoms no changes, no changes in medications, no falls. Doing well otherwise.   Reviewed notes, labs and imaging from outside physicians, which showed:  Reviewed notes from [facility] in [place].  Patient is new to the area and is establishing care.  Patient presented with bilateral acute leg weakness.  She has a past medical history of hyperlipidemia, diabetes and rheumatoid arthritis who presented for evaluation of new weakness in March 2020.  In January 2020 she developed relatively acute to subacute onset of weakness in both lower extremities.  This began without any clear provocation but is worsened over the month.  Now experiencing moderately severe constant and chronic weakness of both lower extremities.  Her weakness is worse when she is on her feet and fully goes away.  She is having to use cane to walk and prior to January she was able to walk without any assistance.  No family history of similar things.  She has no prior history of similar symptoms.  No changes in sensation such as weakness, tingling or numbness.  No significant pain in  the back of the legs themselves.  Right side appears more affected than the left but both sides are affected.  She underwent a laboratory evaluation largely normal with a CK of 325.  She is on lovastatin and methotrexate.  She is retired.  She is right-handed.  Neurologic exam was unremarkable except for 4- out of 5 strength in the bilateral tibialis anterior, no atrophy or fasciculations noted, intact to light touch, pinprick and vibration, no dysmetria, normal toe and heel walking normal tandem great, absent at ankles but otherwise DTRs 2+.  EMG nerve conduction study was obtained.  So was MRI of the lumbar spine.  She is on methotrexate for rheumatoid arthritis.  She was continued on lovastatin.  Personally reviewed MRI of the low spine which showed as above noted.  Reviewed EMG nerve conduction study data the bilateral tibialis anterior muscles and medial gastroc muscles demonstrated 2+ to 3+ insertional activity with markedly reduced recruitment and increased motor unit amplitude and duration.  The bilateral vastus lateralis and vastus medialis muscles demonstrated rare insertional activity with mildly increased motor unit amplitudes.  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  CTA of the head showed no acute intracranial abnormalities, mild chronic senescent changes, remote left craniotomy and underlying aneurysm repair changes.  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  EMG nerve conduction study showed severe, active in chronic sensorimotor polyneuropathy, inflammatory or autoimmune, toxic or metabolic, or infectious etiology would appear most likely.  A lumbar puncture was ordered and lab work was ordered.  Lumbar puncture showed slightly elevated glucose at 57.  Labs ordered included ANA screen positive titer and pattern 1-1  60 speckled, double-stranded DNA 48 c-ANCA negative, p-ANCA negative, CRP 2.1, ESR 78, serum immunoelectrophoresis showed a questionable faint monoclonal band present in the gamma region of IgG kappa, Lyme disease negative, paraneoplastic antibody found neuronal ACH R ganglionic neuronal antibodies high. Review of Systems: Patient complains of symptoms per HPI as well as the following symptoms: imbalance, needs walking aid.  Pertinent negatives and positives per HPI. All others negative    Social History   Socioeconomic History  . Marital status: Divorced    Spouse name: Not on file  . Number of children: 2  . Years of education: Not on file  . Highest education level: Not on file  Occupational History    Comment: retired  Tobacco Use  . Smoking status: Former Research scientist (life sciences)  . Smokeless tobacco: Never Used  Substance and Sexual Activity  . Alcohol use: Never  . Drug use: Never  . Sexual activity: Not on file  Other Topics Concern  . Not on file  Social History Narrative   Lives at home alone   Right handed   Caffeine: 1/2 and 1/2 coffee daily 1 cup   Social Determinants  of Health   Financial Resource Strain: Not on file  Food Insecurity: Not on file  Transportation Needs: Not on file  Physical Activity: Not on file  Stress: Not on file  Social Connections: Not on file  Intimate Partner Violence: Not on file    Family History  Problem Relation Age of Onset  . Aneurysm Mother        Brain  . Heart disease Father 27  . Aneurysm Sister        Brain  . Neuromuscular disorder Neg Hx     Past Medical History:  Diagnosis Date  . Anxiety   . Depression   . Headache   . Hx of diabetes mellitus    pt states she was told she didn't have to take anything anymore because of her A1C.  Marland Kitchen Hyperlipidemia   . Hypertension   . Neuropathy   . Rheumatoid aortitis   . Vertigo     Patient Active Problem List   Diagnosis Date Noted  . Murmur 04/01/2019  . SOB (shortness of breath)  04/01/2019  . Educated about COVID-19 virus infection 04/01/2019  . Polyneuropathy 12/16/2018  . 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 Obesity (BMI 30-39.9) 12/16/2018    Past Surgical History:  Procedure Laterality Date  . APPENDECTOMY    . BRAIN SURGERY     Repair of an aneurysm  . IR IMAGING GUIDED PORT INSERTION  01/01/2019  . SHOULDER SURGERY Right     Current Outpatient Medications  Medication Sig Dispense Refill  . Ascorbic Acid (VITAMIN C PO) Take 1,060 mg by mouth daily.    . ASPIRIN 81 PO Take 81 mg by mouth daily.    Marland Kitchen atorvastatin (LIPITOR) 20 MG tablet Take 20 mg by mouth daily.    Marland Kitchen b complex vitamins tablet Take 2 tablets by mouth daily.     . calcium carbonate (TUMS - DOSED IN MG ELEMENTAL CALCIUM) 500 MG chewable tablet Chew 1 tablet by mouth as needed.    . Cholecalciferol (VITAMIN D3) 250 MCG (10000 UT) TABS Take by mouth.    . folic acid (FOLVITE) 1 MG tablet Take 1 mg by mouth daily.    . Immune Globulin, Human, (GAMMAGARD IV) Inject into the vein.    Marland Kitchen lidocaine (LMX) 4 % cream Apply 1 application topically as needed. Once monthly for port    . lisinopril-hydrochlorothiazide (ZESTORETIC) 20-25 MG tablet Take 0.5 tablets by mouth daily.    . methotrexate (RHEUMATREX) 2.5 MG tablet Take 15 mg by mouth once a week. Caution:Chemotherapy. Protect from light. Take 5 tabs weekly; Fridays    . Multiple Vitamin (MULTIVITAMIN ADULT PO) Take 1 tablet by mouth daily. "Living Green" tablet    . ondansetron (ZOFRAN ODT) 4 MG disintegrating tablet Take 1 tablet (4 mg total) by mouth every 8 (eight) hours as needed for nausea or vomiting. 12 tablet 1  . predniSONE (DELTASONE) 20 MG tablet Take 1 tablet (20 mg total) by mouth daily with breakfast. 5 tablet 0   No current facility-administered medications for this visit.    Allergies as of 05/06/2020 - Review Complete 05/06/2020  Allergen Reaction Noted  . Penicillins  09/26/2018  . Red  dye Itching 12/25/2018  . Latex Rash 09/26/2018  . Sulfa antibiotics Rash 09/26/2018  . Tetanus toxoids Rash 09/26/2018    Vitals: BP (!) 124/56 (BP Location: Right Arm, Patient Position: Sitting)   Pulse 79   Ht 5' 2.5" (1.588 m)  Wt 187 lb (84.8 kg)   SpO2 98%   BMI 33.66 kg/m  Last Weight:  Wt Readings from Last 1 Encounters:  05/06/20 187 lb (84.8 kg)   Last Height:   Ht Readings from Last 1 Encounters:  05/06/20 5' 2.5" (1.588 m)     Physical exam: no changes Exam: Gen: NAD, conversant, well nourised, obese, well groomed                     CV: RRR, no MRG. No Carotid Bruits. No peripheral edema, warm, nontender Eyes: Conjunctivae clear without exudates or hemorrhage  Neuro: no changes Detailed Neurologic Exam  Speech:    Speech is normal; fluent and spontaneous with normal comprehension.  Cognition:    The patient is oriented to person, place, and time;     recent and remote memory intact;     language fluent;     normal attention, concentration,     fund of knowledge Cranial Nerves:    The pupils are equal, round, and reactive to light.   Visual fields are full to finger confrontation. Extraocular movements are intact. Trigeminal sensation is intact and the muscles of mastication are normal. The face is symmetric. The palate elevates in the midline. Hearing intact. Voice is normal. Shoulder shrug is normal. The tongue has normal motion without fasciculations.   Gait: Good arm swing, short steps and slightly wide based but erect and balance stable   Motor Observation:   Mouth movements Tone:    Normal muscle tone.    Posture:    Posture is normal. normal erect    Strength: Mild prx weakness hip flexion and delt,biceps,triceps 4/5         Reflex Exam:  DTR's:    Absent AJs. Trace patellars. 1+ biceps.  Toes:    The toes are equiv bilaterally.   Clonus:    Clonus is absent.    Assessment/Plan: 80 year old patient with a past medical history  of diabetes, hyperlipidemia, hypertension, rheumatoid arthritis on methotrexate who was previously managed by neurology for acute to subacute onset of weakness in the legs, EMG nerve conduction study demonstrated a relatively severe, active and chronic sensorimotor polyneuropathy, and inflammatory/autoimmune/toxic/metabolic and infectious work-up was completed which revealed elevated ESR CRP, positive ANA and weakly positive SPEP IgG likely related to her known rheumatoid arthritis.  Also found to have a mildly elevated a CHR ganglionic neuronal antibody.  Her LP showed elevated protein of 57 but otherwise normal.  She was treated with IVIG.   Emg/ncs check both uppers and one leg Home PT for imbalance, weakness,  Reduce IVIG to every 5 weeks over 3 days (made request)   Orders Placed This Encounter  Procedures  . CK  . CBC with Differential/Platelets  . Comprehensive metabolic panel  . B12 and Folate Panel  . Methylmalonic acid, serum  . Ambulatory referral to Home Health  . NCV with EMG(electromyography)     PRIOR: - Vertigo Better - Neuropathy continues to improve but she is not ready to change from every 4 weeks to every 5 weeks. She is also nervous to change from 5-day infusion. Will hold for now. - Difficult getting lines for IV, since IVIG may be ongoing for a year or longer will order a port - obesity: healthy weight and wellness center, discussed diet and exercise  Orders Placed This Encounter  Procedures  . CK  . CBC with Differential/Platelets  . Comprehensive metabolic panel  . B12 and Folate  Panel  . Methylmalonic acid, serum  . Ambulatory referral to Home Health  . NCV with EMG(electromyography)     Cc: Caren Macadam, MD,    I spent 40 minutes of face-to-face and non-face-to-face time with patient on the  1. Polyneuropathy   2. Long term use of drug   3. Gait disorder   4. Imbalance   5. Rash    diagnosis.  This included previsit chart review, lab review,  study review, order entry, electronic health record documentation, patient education on the different diagnostic and therapeutic options, counseling and coordination of care, risks and benefits of management, compliance, or risk factor reduction   Sarina Ill, MD  [facility] [address] [place], [place] [address]  Phone [phone] Fax [phone]

## 2020-05-10 ENCOUNTER — Telehealth: Payer: Self-pay | Admitting: *Deleted

## 2020-05-10 DIAGNOSIS — G6181 Chronic inflammatory demyelinating polyneuritis: Secondary | ICD-10-CM | POA: Diagnosis not present

## 2020-05-10 NOTE — Telephone Encounter (Signed)
I sent a secure request to Meda Klinefelter w/ [facility] infusion requesting, per Dr Lucia Gaskins, to push the IVIG out to every 5 weeks and over 3 days instead.

## 2020-05-11 DIAGNOSIS — G629 Polyneuropathy, unspecified: Secondary | ICD-10-CM | POA: Diagnosis not present

## 2020-05-11 LAB — CBC WITH DIFFERENTIAL/PLATELET
Basophils Absolute: 0 10*3/uL (ref 0.0–0.2)
Basos: 0 %
EOS (ABSOLUTE): 0.1 10*3/uL (ref 0.0–0.4)
Eos: 2 %
Hematocrit: 35.2 % (ref 34.0–46.6)
Hemoglobin: 11.5 g/dL (ref 11.1–15.9)
Immature Grans (Abs): 0 10*3/uL (ref 0.0–0.1)
Immature Granulocytes: 0 %
Lymphocytes Absolute: 1.7 10*3/uL (ref 0.7–3.1)
Lymphs: 38 %
MCH: 33.6 pg — ABNORMAL HIGH (ref 26.6–33.0)
MCHC: 32.7 g/dL (ref 31.5–35.7)
MCV: 103 fL — ABNORMAL HIGH (ref 79–97)
Monocytes Absolute: 0.6 10*3/uL (ref 0.1–0.9)
Monocytes: 13 %
Neutrophils Absolute: 2.1 10*3/uL (ref 1.4–7.0)
Neutrophils: 47 %
Platelets: 281 10*3/uL (ref 150–450)
RBC: 3.42 x10E6/uL — ABNORMAL LOW (ref 3.77–5.28)
RDW: 18.3 % — ABNORMAL HIGH (ref 11.7–15.4)
WBC: 4.5 10*3/uL (ref 3.4–10.8)

## 2020-05-11 LAB — COMPREHENSIVE METABOLIC PANEL
ALT: 26 IU/L (ref 0–32)
AST: 28 IU/L (ref 0–40)
Albumin/Globulin Ratio: 0.8 — ABNORMAL LOW (ref 1.2–2.2)
Albumin: 3.9 g/dL (ref 3.7–4.7)
Alkaline Phosphatase: 109 IU/L (ref 44–121)
BUN/Creatinine Ratio: 20 (ref 12–28)
BUN: 17 mg/dL (ref 8–27)
Bilirubin Total: 0.4 mg/dL (ref 0.0–1.2)
CO2: 24 mmol/L (ref 20–29)
Calcium: 9.5 mg/dL (ref 8.7–10.3)
Chloride: 100 mmol/L (ref 96–106)
Creatinine, Ser: 0.85 mg/dL (ref 0.57–1.00)
Globulin, Total: 4.6 g/dL — ABNORMAL HIGH (ref 1.5–4.5)
Glucose: 80 mg/dL (ref 65–99)
Potassium: 4.4 mmol/L (ref 3.5–5.2)
Sodium: 136 mmol/L (ref 134–144)
Total Protein: 8.5 g/dL (ref 6.0–8.5)
eGFR: 71 mL/min/{1.73_m2} (ref >59)

## 2020-05-11 LAB — METHYLMALONIC ACID, SERUM: Methylmalonic Acid: 168 nmol/L (ref 0–378)

## 2020-05-11 LAB — B12 AND FOLATE PANEL
Folate: >20 ng/mL (ref >3.0)
Vitamin B-12: >2000 pg/mL — ABNORMAL HIGH (ref 232–1245)

## 2020-05-11 LAB — CK: Total CK: 97 U/L (ref 32–182)

## 2020-05-12 DIAGNOSIS — G629 Polyneuropathy, unspecified: Secondary | ICD-10-CM | POA: Diagnosis not present

## 2020-05-12 NOTE — Telephone Encounter (Signed)
Received orders from [facility] infusion for Dr Lucia Gaskins to sign indicating IVIG  180 grams divided over 3 days every 5 weeks. Orders signed by MD and faxed back to [facility]. Received a receipt of confirmation.

## 2020-05-13 DIAGNOSIS — G629 Polyneuropathy, unspecified: Secondary | ICD-10-CM | POA: Diagnosis not present

## 2020-05-14 DIAGNOSIS — G629 Polyneuropathy, unspecified: Secondary | ICD-10-CM | POA: Diagnosis not present

## 2020-05-15 DIAGNOSIS — G629 Polyneuropathy, unspecified: Secondary | ICD-10-CM | POA: Diagnosis not present

## 2020-05-21 ENCOUNTER — Ambulatory Visit: Payer: Medicare Other | Admitting: Allergy and Immunology

## 2020-06-02 DIAGNOSIS — E782 Mixed hyperlipidemia: Secondary | ICD-10-CM | POA: Diagnosis not present

## 2020-06-02 DIAGNOSIS — M810 Age-related osteoporosis without current pathological fracture: Secondary | ICD-10-CM | POA: Diagnosis not present

## 2020-06-02 DIAGNOSIS — I10 Essential (primary) hypertension: Secondary | ICD-10-CM | POA: Diagnosis not present

## 2020-06-02 DIAGNOSIS — Z79899 Other long term (current) drug therapy: Secondary | ICD-10-CM | POA: Diagnosis not present

## 2020-06-02 DIAGNOSIS — E559 Vitamin D deficiency, unspecified: Secondary | ICD-10-CM | POA: Diagnosis not present

## 2020-06-02 DIAGNOSIS — Z0000 Encounter for general adult medical examination without abnormal findings: Secondary | ICD-10-CM | POA: Diagnosis not present

## 2020-06-02 DIAGNOSIS — I700 Atherosclerosis of aorta: Secondary | ICD-10-CM | POA: Diagnosis not present

## 2020-06-02 DIAGNOSIS — H1013 Acute atopic conjunctivitis, bilateral: Secondary | ICD-10-CM | POA: Diagnosis not present

## 2020-06-02 DIAGNOSIS — R42 Dizziness and giddiness: Secondary | ICD-10-CM | POA: Diagnosis not present

## 2020-06-02 DIAGNOSIS — Z23 Encounter for immunization: Secondary | ICD-10-CM | POA: Diagnosis not present

## 2020-06-02 DIAGNOSIS — E118 Type 2 diabetes mellitus with unspecified complications: Secondary | ICD-10-CM | POA: Diagnosis not present

## 2020-06-04 DIAGNOSIS — E118 Type 2 diabetes mellitus with unspecified complications: Secondary | ICD-10-CM | POA: Diagnosis not present

## 2020-06-04 DIAGNOSIS — M810 Age-related osteoporosis without current pathological fracture: Secondary | ICD-10-CM | POA: Diagnosis not present

## 2020-06-04 DIAGNOSIS — E7800 Pure hypercholesterolemia, unspecified: Secondary | ICD-10-CM | POA: Diagnosis not present

## 2020-06-04 DIAGNOSIS — E782 Mixed hyperlipidemia: Secondary | ICD-10-CM | POA: Diagnosis not present

## 2020-06-04 DIAGNOSIS — I10 Essential (primary) hypertension: Secondary | ICD-10-CM | POA: Diagnosis not present

## 2020-06-04 DIAGNOSIS — M0579 Rheumatoid arthritis with rheumatoid factor of multiple sites without organ or systems involvement: Secondary | ICD-10-CM | POA: Diagnosis not present

## 2020-06-07 ENCOUNTER — Other Ambulatory Visit: Payer: Self-pay

## 2020-06-07 ENCOUNTER — Ambulatory Visit (INDEPENDENT_AMBULATORY_CARE_PROVIDER_SITE_OTHER): Payer: Medicare Other | Admitting: Neurology

## 2020-06-07 ENCOUNTER — Ambulatory Visit: Payer: Medicare Other | Admitting: Neurology

## 2020-06-07 DIAGNOSIS — G908 Other disorders of autonomic nervous system: Secondary | ICD-10-CM

## 2020-06-07 DIAGNOSIS — Z79899 Other long term (current) drug therapy: Secondary | ICD-10-CM

## 2020-06-07 DIAGNOSIS — G629 Polyneuropathy, unspecified: Secondary | ICD-10-CM | POA: Diagnosis not present

## 2020-06-07 DIAGNOSIS — Z0289 Encounter for other administrative examinations: Secondary | ICD-10-CM

## 2020-06-07 NOTE — Procedures (Signed)
Full Name: Sarah Whitaker Gender: Female MRN #: [id number] Date of Birth: 02/17/1941    Visit Date: 06/07/2020 14:02 Age: 80 Years Examining Physician: Sarina Ill, MD  Primary Care Provider:  Caren Macadam, MD  History: 80 year old patient with a past medical history of diabetes, hyperlipidemia, hypertension, rheumatoid arthritis on methotrexate who was previously managed by outside neurology for acute to subacute onset of weakness in the legs, EMG nerve conduction study at outside neurologist's office demonstrated a severe, active and chronic sensorimotor polyneuropathy, and inflammatory/autoimmune/toxic/metabolic and infectious work-up was completed which revealed elevated ESR CRP, positive ANA and weakly positive SPEP IgG likely related to her known rheumatoid arthritis.  Also found to have a mildly elevated ACHR ganglionic neuronal antibody.  Her LP showed elevated protein of 57 but otherwise normal.  She was started on IVIG by prior neurologist and transferred for care to [place] at [facility]. Continues to be treated with IVIG with good response.  Summary: EMG/NCS was performed on the right upper and right lower extremities.  The right peroneal motor nerve showed no response.  The right tibial motor nerve showed no response.  The right sural sensory nerve showed reduced amplitude (5 V, normal greater than 6).  The right superficial peroneal sensory nerve showed reduced amplitude (3 V, normal greater than 6).  The right tibial F wave showed no response. All remaining nerves (as indicated in the following tables) were within normal limits.  The right tibialis anterior, right gastrocnemius and right extensor hallucis longus muscles showed spontaneous activity, increased motor unit amplitude, prolonged motor unit duration, polyphasic motor units and diminished motor unit recruitment.All remaining muscles (as indicated in the following tables) were within normal limits.         Conclusion: There is electrophysiologic evidence for a distal, length-dependent, axonal, predominantly-motor polyneuropathy with acute/ongoing denervation and chronic neurogenic changes in distal muscles of the lower extremities.     Sarina Ill, M.D.  [facility] [address], [place], [place] [address] Tel: [phone] Fax: [phone]  Verbal informed consent was obtained from the patient, patient was informed of potential risk of procedure, including bruising, bleeding, hematoma formation, infection, muscle weakness, muscle pain, numbness, among others.        [place]    Nerve / Sites Muscle Latency Ref. Amplitude Ref. Rel Amp Segments Distance Velocity Ref. Area    ms ms mV mV %  cm m/s m/s mVms  R Median - APB     Wrist APB 4.0 ?4.4 5.7 ?4.0 100 Wrist - APB 7   16.6     Upper arm APB 8.1  4.6  80.5 Upper arm - Wrist 23 56 ?49 13.9  R Ulnar - ADM     Wrist ADM 2.1 ?3.3 6.0 ?6.0 100 Wrist - ADM 7   16.6     B.Elbow ADM 5.9  6.8  114 B.Elbow - Wrist 19 49 ?49 19.0     A.Elbow ADM 7.9  6.8  100 A.Elbow - B.Elbow 10 49 ?49 20.8  R Peroneal - EDB     Ankle EDB NR ?6.5 NR ?2.0 NR Ankle - EDB 9   NR     Fib head EDB NR  NR  NR Fib head - Ankle 27 NR ?44 NR     Pop fossa EDB NR  NR  NR Pop fossa - Fib head 10 NR ?44 NR         Pop fossa -  Ankle      R Tibial - AH     Ankle AH NR ?5.8 NR ?4.0 NR Ankle - AH 9 NR  NR             SNC    Nerve / Sites Rec. Site Peak Lat Ref.  Amp Ref. Segments Distance Peak Diff Ref.    ms ms V V  cm ms ms  R Sural - Ankle (Calf)     Calf Ankle 4.4 ?4.4 5 ?6 Calf - Ankle 14    R Superficial peroneal - Ankle     Lat leg Ankle 3.7 ?4.4 3 ?6 Lat leg - Ankle 14    R Median, Ulnar - Transcarpal comparison     Median Palm Wrist 2.2 ?2.2 76 ?35 Median Palm - Wrist 8       Ulnar Palm Wrist 1.9 ?2.2 31 ?12 Ulnar Palm - Wrist 8          Median Palm - Ulnar Palm  0.3 ?0.4  R Median - Orthodromic (Dig II, Mid palm)     Dig II  Wrist 3.3 ?3.4 12 ?10 Dig II - Wrist 13    R Ulnar - Orthodromic, (Dig V, Mid palm)     Dig V Wrist 2.6 ?3.1 6 ?5 Dig V - Wrist 72                 F  Wave    Nerve F Lat Ref.   ms ms  R Ulnar - ADM 28.5 ?32.0  R Tibial - AH NR ?56.0         EMG Summary Table    Spontaneous MUAP Recruitment  Muscle IA Fib PSW Fasc Other Amp Dur. Poly Pattern  R. Iliopsoas Normal None None None _______ Normal Normal Normal Normal  R. Vastus medialis Normal None None None _______ Normal Normal Normal Normal  R. Tibialis anterior Normal None 3+ None _______ Increased Increased 1+ Reduced  R. Gastrocnemius (Medial head) Normal None 3+ None _______ Increased Increased 3+ Reduced  R. Extensor hallucis longus Normal None 3+ None _______ Increased Increased 2+ Reduced  R. Abductor hallucis Normal None None None _______ Normal Normal Normal Normal   R. Biceps Femoris(long head) Normal None None None _______ Normal Normal Normal Normal  R Gluteus Max and Med Normal None None None _______ Normal Normal Normal Normal  R Lumbar Paraspinals Lower Normal None None None _______ Normal Normal Normal Normal  R. Deltoid Normal None None None _______ Normal Normal Normal Normal  R. Triceps brachii Normal None None None _______ Normal Normal Normal Normal  R Pronator teres Normal None None None _______ Normal Normal Normal Normal  R. Biceps brachii Normal None None None _______ Normal Normal Normal Normal  R First dorsal interosseous Normal None None None _______ Normal Normal Normal Normal

## 2020-06-07 NOTE — Progress Notes (Signed)
See procedure note.

## 2020-06-07 NOTE — Progress Notes (Signed)
Full Name: Sarah Whitaker Gender: Female MRN #: [id number] Date of Birth: [birth year]    Visit Date: 06/07/2020 14:02 Age: 80 Years Examining Physician: Sarina Ill, MD  Primary Care Provider:  Caren Macadam, MD  History: 80 year old patient with a past medical history of diabetes, hyperlipidemia, hypertension, rheumatoid arthritis on methotrexate who was previously managed by outside neurology for acute to subacute onset of weakness in the legs, EMG nerve conduction study at outside neurologist's office demonstrated a severe, active and chronic sensorimotor polyneuropathy, and inflammatory/autoimmune/toxic/metabolic and infectious work-up was completed which revealed elevated ESR CRP, positive ANA and weakly positive SPEP IgG likely related to her known rheumatoid arthritis.  Also found to have a mildly elevated ACHR ganglionic neuronal antibody.  Her LP showed elevated protein of 57 but otherwise normal.  She was started on IVIG by prior neurologist and transferred for care to [place] at [facility]. Continues to be treated with IVIG.  Summary: EMG/NCS was performed on the right upper and right lower extremities.  The right peroneal motor nerve showed no response.  The right tibial motor nerve showed no response.  The right sural sensory nerve showed reduced amplitude (5 V, normal greater than 6).  The right superficial peroneal sensory nerve showed reduced amplitude (3 V, normal greater than 6).  The right tibial F wave showed no response. All remaining nerves (as indicated in the following tables) were within normal limits.  The right tibialis anterior, right gastrocnemius and right extensor hallucis longus muscles showed spontaneous activity, increased motor unit amplitude, prolonged motor unit duration, polyphasic motor units and diminished motor unit recruitment.All remaining muscles (as indicated in the following tables) were within normal limits.        Conclusion: There  is electrophysiologic evidence for a distal, length-dependent, axonal, predominantly-motor polyneuropathy with acute/ongoing denervation and chronic neurogenic changes in distal muscles of the lower extremities.     Sarina Ill, M.D.  [facility] [address], [place], [place] [address] Tel: [phone] Fax: [phone]  Verbal informed consent was obtained from the patient, patient was informed of potential risk of procedure, including bruising, bleeding, hematoma formation, infection, muscle weakness, muscle pain, numbness, among others.        [place]    Nerve / Sites Muscle Latency Ref. Amplitude Ref. Rel Amp Segments Distance Velocity Ref. Area    ms ms mV mV %  cm m/s m/s mVms  R Median - APB     Wrist APB 4.0 ?4.4 5.7 ?4.0 100 Wrist - APB 7   16.6     Upper arm APB 8.1  4.6  80.5 Upper arm - Wrist 23 56 ?49 13.9  R Ulnar - ADM     Wrist ADM 2.1 ?3.3 6.0 ?6.0 100 Wrist - ADM 7   16.6     B.Elbow ADM 5.9  6.8  114 B.Elbow - Wrist 19 49 ?49 19.0     A.Elbow ADM 7.9  6.8  100 A.Elbow - B.Elbow 10 49 ?49 20.8  R Peroneal - EDB     Ankle EDB NR ?6.5 NR ?2.0 NR Ankle - EDB 9   NR     Fib head EDB NR  NR  NR Fib head - Ankle 27 NR ?44 NR     Pop fossa EDB NR  NR  NR Pop fossa - Fib head 10 NR ?44 NR         Pop fossa - Ankle  R Tibial - AH     Ankle AH NR ?5.8 NR ?4.0 NR Ankle - AH 9 NR  NR             SNC    Nerve / Sites Rec. Site Peak Lat Ref.  Amp Ref. Segments Distance Peak Diff Ref.    ms ms V V  cm ms ms  R Sural - Ankle (Calf)     Calf Ankle 4.4 ?4.4 5 ?6 Calf - Ankle 14    R Superficial peroneal - Ankle     Lat leg Ankle 3.7 ?4.4 3 ?6 Lat leg - Ankle 14    R Median, Ulnar - Transcarpal comparison     Median Palm Wrist 2.2 ?2.2 76 ?35 Median Palm - Wrist 8       Ulnar Palm Wrist 1.9 ?2.2 31 ?12 Ulnar Palm - Wrist 8          Median Palm - Ulnar Palm  0.3 ?0.4  R Median - Orthodromic (Dig II, Mid palm)     Dig II Wrist 3.3 ?3.4 12 ?10  Dig II - Wrist 13    R Ulnar - Orthodromic, (Dig V, Mid palm)     Dig V Wrist 2.6 ?3.1 6 ?5 Dig V - Wrist 12                 F  Wave    Nerve F Lat Ref.   ms ms  R Ulnar - ADM 28.5 ?32.0  R Tibial - AH NR ?56.0         EMG Summary Table    Spontaneous MUAP Recruitment  Muscle IA Fib PSW Fasc Other Amp Dur. Poly Pattern  R. Iliopsoas Normal None None None _______ Normal Normal Normal Normal  R. Vastus medialis Normal None None None _______ Normal Normal Normal Normal  R. Tibialis anterior Normal None 3+ None _______ Increased Increased 1+ Reduced  R. Gastrocnemius (Medial head) Normal None 3+ None _______ Increased Increased 3+ Reduced  R. Extensor hallucis longus Normal None 3+ None _______ Increased Increased 2+ Reduced  R. Abductor hallucis Normal None None None _______ Normal Normal Normal Normal   R. Biceps Femoris(long head) Normal None None None _______ Normal Normal Normal Normal  R Gluteus Max and Med Normal None None None _______ Normal Normal Normal Normal  R Lumbar Paraspinals Lower Normal None None None _______ Normal Normal Normal Normal  R. Deltoid Normal None None None _______ Normal Normal Normal Normal  R. Triceps brachii Normal None None None _______ Normal Normal Normal Normal  R Pronator teres Normal None None None _______ Normal Normal Normal Normal  R. Biceps brachii Normal None None None _______ Normal Normal Normal Normal  R First dorsal interosseous Normal None None None _______ Normal Normal Normal Normal

## 2020-06-14 DIAGNOSIS — G6181 Chronic inflammatory demyelinating polyneuritis: Secondary | ICD-10-CM | POA: Diagnosis not present

## 2020-06-15 DIAGNOSIS — G6181 Chronic inflammatory demyelinating polyneuritis: Secondary | ICD-10-CM | POA: Diagnosis not present

## 2020-06-16 DIAGNOSIS — G6181 Chronic inflammatory demyelinating polyneuritis: Secondary | ICD-10-CM | POA: Diagnosis not present

## 2020-06-21 NOTE — Telephone Encounter (Signed)
Pt's daughter Joni Reining called asking for call back from referral coordinator to discuss home health referral.

## 2020-06-21 NOTE — Telephone Encounter (Signed)
Called PT' daughter back with Update On Home Health.  Waiting for approval  To see if they have staffing . For [facility] .   Spoke to her mother this AM and My chart message sent .   Thanks Annabelle Harman

## 2020-06-30 DIAGNOSIS — R0602 Shortness of breath: Secondary | ICD-10-CM | POA: Diagnosis not present

## 2020-06-30 DIAGNOSIS — M069 Rheumatoid arthritis, unspecified: Secondary | ICD-10-CM | POA: Diagnosis not present

## 2020-06-30 DIAGNOSIS — Z79899 Other long term (current) drug therapy: Secondary | ICD-10-CM | POA: Diagnosis not present

## 2020-06-30 DIAGNOSIS — D649 Anemia, unspecified: Secondary | ICD-10-CM | POA: Diagnosis not present

## 2020-06-30 DIAGNOSIS — M25552 Pain in left hip: Secondary | ICD-10-CM | POA: Diagnosis not present

## 2020-06-30 DIAGNOSIS — M1990 Unspecified osteoarthritis, unspecified site: Secondary | ICD-10-CM | POA: Diagnosis not present

## 2020-07-12 DIAGNOSIS — G6181 Chronic inflammatory demyelinating polyneuritis: Secondary | ICD-10-CM | POA: Diagnosis not present

## 2020-07-14 NOTE — Telephone Encounter (Addendum)
I spoke with Phill Mutter, patient's daughter, today regarding home health referral. Joni Reining let me know that we have tried to get patient set up with 3 home health agencies over the course of 2 months and have been unsuccessful. I told Joni Reining that I have sent a message to Kieth Brightly with [phone] ([facility]) to check the status of the referral. I have not heard anything back yet, but informed Joni Reining that when I hear something I will let her know. I apologized for any inconveniences and for how difficult the process has been. She verbalized understanding and appreciation. She let me know that she does not check MyChart, so any updates we get we will need to call her.

## 2020-07-14 NOTE — Telephone Encounter (Signed)
I spoke with Phill Mutter, patient's daughter, today regarding home health referral. Joni Reining let me know that we have tried to get patient set up with 3 home health agencies over the course of 2 months and have been unsuccessful. I told Joni Reining that I have sent a message to Kieth Brightly with [phone] ([facility]) to check the status of the referral. I have not heard anything back yet, but informed Joni Reining that when I hear something I will let her know. I apologized for any inconveniences and for how difficult the process has been. She verbalized understanding and appreciation.  She also let me know that she does not check MyChart, so any updates we have we will need to call her.

## 2020-07-19 NOTE — Telephone Encounter (Signed)
Sarah Whitaker with [facility] got back to me and advised that they are not able to accept patient's insurance at this time. She directed me to Sarah Whitaker with [facility]. I reached out to her and she states she will get back to me.  [email or web address]  I called Sarah Whitaker, patient's daughter, to give her an update.

## 2020-07-20 DIAGNOSIS — G6181 Chronic inflammatory demyelinating polyneuritis: Secondary | ICD-10-CM | POA: Diagnosis not present

## 2020-07-21 DIAGNOSIS — G6181 Chronic inflammatory demyelinating polyneuritis: Secondary | ICD-10-CM | POA: Diagnosis not present

## 2020-07-22 DIAGNOSIS — G6181 Chronic inflammatory demyelinating polyneuritis: Secondary | ICD-10-CM | POA: Diagnosis not present

## 2020-07-26 ENCOUNTER — Other Ambulatory Visit: Payer: Self-pay | Admitting: Neurology

## 2020-07-26 ENCOUNTER — Telehealth: Payer: Self-pay | Admitting: Neurology

## 2020-07-26 MED ORDER — BUTALBITAL-APAP-CAFFEINE 50-325-40 MG PO TABS: 1.0000 | ORAL_TABLET | Freq: Four times a day (QID) | ORAL | 0 refills | Status: DC | PRN

## 2020-07-26 NOTE — Telephone Encounter (Signed)
Pt's daughter, Maida Sale (on [place]) called, she got her IVIG today. She is experiencing nausea, bad headache. Can Dr. Lucia Gaskins recommend anything. Would like a call from the nurse.

## 2020-07-26 NOTE — Telephone Encounter (Signed)
Please advise, thank you.

## 2020-07-26 NOTE — Telephone Encounter (Signed)
Then we should go back to the 5 days a week. I'll call in fioricet

## 2020-07-26 NOTE — Telephone Encounter (Signed)
Spoke with patient's daughter Joni Reining (on [place]).  She had the patient available to provide information.  She stated the patient's last treatment date ended last Thursday and this is the second time she has had a reduced course of 3 days versus 5.  The last time she received the IVIG she had a rough week afterward and had the same symptoms as this time.  She woke up this morning at 3 AM with a headache 8/10 and nausea.  She was treated with Tylenol and Zofran which helped some and brought the pain down to 6/10 and nausea 3-4/10.  Her daughter has been encouraging oral fluid intake which has helped as well but she is concerned the patient does not drink enough to help when she does get the treatment.  She has not had symptoms significant in the past that required any treatment so this is new but has occurred after the shortened therapy time.  We discussed potential prescription of Fioricet (per Dr Lucia Gaskins).  She is amenable to this and can also repeat the Zofran.  They are aware that the patient should go to the ER for evaluation if she worsens.  She will appreciation for the call.

## 2020-07-26 NOTE — Telephone Encounter (Signed)
Spoke with patient's daughter and advised Fioricet was sent to [facility]. Discussed it may make pt sleepy. Joni Reining aware of directions. Also let her know Dr Lucia Gaskins has advised we switch back to 5 day treatment instead of 3 days. She is amenable to the plan.   I will call [facility] tomorrow and request the change to 5 days.

## 2020-07-27 NOTE — Telephone Encounter (Signed)
Sent a secure message to [place] w/ [facility] requesting change back to IVIG infuse over 5 days every 5 weeks instead of over 3 days every 5 weeks.

## 2020-07-29 ENCOUNTER — Other Ambulatory Visit: Payer: Self-pay

## 2020-07-29 ENCOUNTER — Encounter: Payer: Self-pay | Admitting: Allergy

## 2020-07-29 ENCOUNTER — Ambulatory Visit: Payer: Medicare Other | Admitting: Allergy

## 2020-07-29 VITALS — BP 122/62 | HR 91 | Temp 97.9°F | Resp 14 | Ht 62.5 in | Wt 187.6 lb

## 2020-07-29 DIAGNOSIS — H1013 Acute atopic conjunctivitis, bilateral: Secondary | ICD-10-CM | POA: Diagnosis not present

## 2020-07-29 DIAGNOSIS — T781XXD Other adverse food reactions, not elsewhere classified, subsequent encounter: Secondary | ICD-10-CM

## 2020-07-29 DIAGNOSIS — T783XXD Angioneurotic edema, subsequent encounter: Secondary | ICD-10-CM | POA: Diagnosis not present

## 2020-07-29 DIAGNOSIS — J310 Chronic rhinitis: Secondary | ICD-10-CM

## 2020-07-29 DIAGNOSIS — H109 Unspecified conjunctivitis: Secondary | ICD-10-CM

## 2020-07-29 MED ORDER — EPINEPHRINE 0.3 MG/0.3ML IJ SOAJ: 0.3000 mg | Freq: Once | INTRAMUSCULAR | 1 refills | Status: AC

## 2020-07-29 MED ORDER — AZELASTINE-FLUTICASONE 137-50 MCG/ACT NA SUSP: 1.0000 | Freq: Two times a day (BID) | NASAL | 5 refills | Status: AC

## 2020-07-29 MED ORDER — OLOPATADINE HCL 0.2 % OP SOLN: 1.0000 [drp] | Freq: Every day | OPHTHALMIC | 5 refills | Status: AC | PRN

## 2020-07-29 NOTE — Patient Instructions (Addendum)
-  will obtain serum IgE levels for red and yellow food dyes, milk and gluten products.   -will obtain environmental allergy panel  -will obtain angioedema (swelling) panel to ensure you do not have another reason of lip swelling -continue avoidance of red and yellow food dyes.   If you can avoid milk and glutens until labs return -have access to self-injectable epinephrine Epipen 0.3mg  at all times -follow emergency action plan in case of allergic reaction -trial dymista 1 spray each nostril twice a day.  This is a combination nasal spray with Flonase + Astelin (nasal antihistamine).  This helps with both nasal congestion and drainage.  If this is not covered by insurance then will prescribe the two sprays separately.  -for itchy, watery eyes can use Olopatadine 0.2% 1 drop each eye daily as needed -for general allergy symptom control can use antihistamine like Zyrtec, Allegra or Xyzal daily as needed -would recommend stopping Lisinopril medication after talking with the doctor who prescribes it.  Lisinopril is an ACE-inhibitor which is a group of medications that has known side effect of lip and/or tongue swelling.  When there is isolated or random lip or tongue swelling episodes on Lisinopril the best recommendation is to stop this medication.  Swelling can occur at any time while taking Lisinopril or other similar medications and can continue to have swelling episodes for several months even after discontinuation.    Follow-up in 3-4 months or sooner if needed

## 2020-07-29 NOTE — Progress Notes (Signed)
New Patient Note  RE: Sarah Whitaker MRN: [id number] DOB: [birth year] Date of Office Visit: 07/29/2020 Primary care provider: Aliene Beams, MD  Chief Complaint: allergies  History of present illness: Sarah Whitaker is a 80 y.o. female presenting today for evaluation of allergies (environmental and food).  She presents today with her daughter.   She states about 20 years ago she had a reaction she thinks to red dye in food where she had hives and swelling.  When she has had orange jello she also has developed hives and swelling.  However her daughter states she has had lip swelling that they are not sure what triggered the events.  She has had more than 1 episode of lip swelling where she did not have any food containing any food dyes.  Looking through her medication list she is on lisinopril.  Daughter showed me a picture of a lip swelling episode that does show visible upper lip swelling.  When she has milk she develops gas and nausea for the past 6 weeks. She does eat cheese regularly like she does milk.  Her daughter is also concerned maybe she has developed a gluten intolerance or allergy.  She reports itchy, watery eyes, burning eyes, constant nasal drip (daughter states she throat clears often), nasal congestion, and sneezing.   She has used a lubricating eyedrop.  She occasionally will use flonase and does feel it helps when used.   She had a sinus infection about 6 months ago.    She has lived in the [place] area now for about 2 years.  She states she felt like she could feel her allergy starting when she drove up to the [facility] area.  She is from [place].   Review of systems: Review of Systems  Constitutional: Negative.   HENT:       See HPI  Eyes:       See HPI  Respiratory: Negative.   Cardiovascular: Negative.   Gastrointestinal:       See HPI  Musculoskeletal: Negative.   Skin: Negative.   Neurological: Negative.     All other systems negative unless noted above in  HPI  Past medical history: Past Medical History:  Diagnosis Date  . Anxiety   . Depression   . Eczema   . Headache   . Hx of diabetes mellitus    pt states she was told she didn't have to take anything anymore because of her A1C.  Marland Kitchen Hyperlipidemia   . Hypertension   . Neuropathy   . Rheumatoid aortitis   . Vertigo     Past surgical history: Past Surgical History:  Procedure Laterality Date  . APPENDECTOMY    . BRAIN SURGERY     Repair of an aneurysm  . IR IMAGING GUIDED PORT INSERTION  01/01/2019  . SHOULDER SURGERY Right   . TONSILLECTOMY      Family history:  Family History  Problem Relation Age of Onset  . Aneurysm Mother        Brain  . Heart disease Father 11  . Aneurysm Sister        Brain  . Neuromuscular disorder Neg Hx     Social history: Lives in an apartment with carpeting in the bedroom with electric heating and central cooling.  No pets in the home.  There is no concern for water damage, mildew or roaches in the home.  She is retired.  She denies a smoking history.  Medication List: Current Outpatient  Medications  Medication Sig Dispense Refill  . alendronate (FOSAMAX) 70 MG tablet Take 70 mg by mouth once a week.    . ALPRAZolam (XANAX) 0.5 MG tablet Take by mouth.    . Ascorbic Acid (VITAMIN C PO) Take 1,060 mg by mouth daily.    . ASPIRIN 81 PO Take 81 mg by mouth daily.    Marland Kitchen atorvastatin (LIPITOR) 20 MG tablet Take 20 mg by mouth daily.    Marland Kitchen b complex vitamins tablet Take 2 tablets by mouth daily.     . calcium carbonate (TUMS - DOSED IN MG ELEMENTAL CALCIUM) 500 MG chewable tablet Chew 1 tablet by mouth as needed.    . folic acid (FOLVITE) 1 MG tablet Take 1 mg by mouth daily.    . Immune Globulin, Human, (GAMMAGARD IV) Inject into the vein.    Marland Kitchen lidocaine (LMX) 4 % cream Apply 1 application topically as needed. Once monthly for port    . lisinopril (ZESTRIL) 20 MG tablet     . lisinopril-hydrochlorothiazide (ZESTORETIC) 20-25 MG tablet Take  0.5 tablets by mouth daily.    . meclizine (ANTIVERT) 25 MG tablet Take 25 mg by mouth 2 (two) times daily as needed.    . methotrexate (RHEUMATREX) 2.5 MG tablet Take 15 mg by mouth once a week. Caution:Chemotherapy. Protect from light. Take 5 tabs weekly; Fridays    . Multiple Vitamin (MULTIVITAMIN ADULT PO) Take 1 tablet by mouth daily. "Living Green" tablet    . ondansetron (ZOFRAN ODT) 4 MG disintegrating tablet Take 1 tablet (4 mg total) by mouth every 8 (eight) hours as needed for nausea or vomiting. 12 tablet 1  . ONETOUCH VERIO test strip 1 each daily.    . butalbital-acetaminophen-caffeine (FIORICET) 50-325-40 MG tablet Take 1-2 tablets by mouth every 6 (six) hours as needed for headache. (Patient not taking: Reported on 07/29/2020) 30 tablet 0  . Cholecalciferol (VITAMIN D3) 250 MCG (10000 UT) TABS Take by mouth. (Patient not taking: Reported on 07/29/2020)    . predniSONE (DELTASONE) 20 MG tablet Take 1 tablet (20 mg total) by mouth daily with breakfast. (Patient not taking: Reported on 07/29/2020) 5 tablet 0   No current facility-administered medications for this visit.    Known medication allergies: Allergies  Allergen Reactions  . Red Dye Itching  . Latex Rash  . Penicillins Rash    Did it involve swelling of the face/tongue/throat, SOB, or low BP? No Did it involve sudden or severe rash/hives, skin peeling, or any reaction on the inside of your mouth or nose? Yes Did you need to seek medical attention at a hospital or doctor's office? Yes When did it last happen?in her 11s If all above answers are "NO", may proceed with cephalosporin use.  Other reaction(s): Unknown Did it involve swelling of the face/tongue/throat, SOB, or low BP? No Did it involve sudden or severe rash/hives, skin peeling, or any reaction on the inside of your mouth or nose? Yes Did you need to seek medical attention at a hospital or doctor's office? Yes When did it last happen?in her 32s If  all above answers are "NO", may proceed with cephalosporin use.   . Sulfa Antibiotics Rash    Blisters  . Tetanus Toxoids Rash     Physical examination: Blood pressure 122/62, pulse 91, temperature 97.9 F (36.6 C), resp. rate 14, height 5' 2.5" (1.588 m), weight 187 lb 9.6 oz (85.1 kg), SpO2 98 %.  General: Alert, interactive, in no acute distress.  HEENT: PERRLA, TMs pearly gray, turbinates mildly edematous without discharge, post-pharynx non erythematous. Neck: Supple without lymphadenopathy. Lungs: Clear to auscultation without wheezing, rhonchi or rales. {no increased work of breathing. CV: Normal S1, S2 without murmurs. Abdomen: Nondistended, nontender. Skin: Warm and dry, without lesions or rashes. Extremities:  No clubbing, cyanosis or edema. Neuro:   Grossly intact.  Diagnositics/Labs: None today  Assessment and plan: Rhinoconjunctivitis, presumed allergic Adverse food reaction Angioedema   -will obtain serum IgE levels for red and yellow food dyes, milk and gluten products.   -will obtain environmental allergy panel  -will obtain angioedema (swelling) panel to ensure you do not have another reason of lip swelling -continue avoidance of red and yellow food dyes.   If you can avoid milk and glutens until labs return -have access to self-injectable epinephrine Epipen 0.3mg  at all times -follow emergency action plan in case of allergic reaction -trial dymista 1 spray each nostril twice a day.  This is a combination nasal spray with Flonase + Astelin (nasal antihistamine).  This helps with both nasal congestion and drainage.  If this is not covered by insurance then will prescribe the two sprays separately.  -for itchy, watery eyes can use Olopatadine 0.2% 1 drop each eye daily as needed -for general allergy symptom control can use antihistamine like Zyrtec, Allegra or Xyzal daily as needed -would recommend stopping Lisinopril medication after talking with the doctor who  prescribes it.  Lisinopril is an ACE-inhibitor which is a group of medications that has known side effect of lip and/or tongue swelling.  When there is isolated or random lip or tongue swelling episodes on Lisinopril the best recommendation is to stop this medication.  Swelling can occur at any time while taking Lisinopril or other similar medications and can continue to have swelling episodes for several months even after discontinuation.    Follow-up in 3-4 months or sooner if needed  I appreciate the opportunity to take part in Paysen's care. Please do not hesitate to contact me with questions.  Sincerely,   Margo Aye, MD Allergy/Immunology [facility]

## 2020-08-03 DIAGNOSIS — M810 Age-related osteoporosis without current pathological fracture: Secondary | ICD-10-CM | POA: Diagnosis not present

## 2020-08-03 DIAGNOSIS — I10 Essential (primary) hypertension: Secondary | ICD-10-CM | POA: Diagnosis not present

## 2020-08-03 DIAGNOSIS — E782 Mixed hyperlipidemia: Secondary | ICD-10-CM | POA: Diagnosis not present

## 2020-08-03 DIAGNOSIS — E118 Type 2 diabetes mellitus with unspecified complications: Secondary | ICD-10-CM | POA: Diagnosis not present

## 2020-08-03 DIAGNOSIS — E7800 Pure hypercholesterolemia, unspecified: Secondary | ICD-10-CM | POA: Diagnosis not present

## 2020-08-03 DIAGNOSIS — M0579 Rheumatoid arthritis with rheumatoid factor of multiple sites without organ or systems involvement: Secondary | ICD-10-CM | POA: Diagnosis not present

## 2020-08-03 LAB — C4 COMPLEMENT: Complement C4, Serum: 14 mg/dL (ref 12–38)

## 2020-08-03 LAB — C1 ESTERASE INHIBITOR, FUNCTIONAL: C1INH Functional/C1INH Total MFr SerPl: 92 %mean normal

## 2020-08-03 LAB — C1 ESTERASE INHIBITOR: C1INH SerPl-mCnc: 32 mg/dL (ref 21–39)

## 2020-08-03 LAB — COMPLEMENT COMPONENT C1Q: Complement C1Q: 15 mg/dL (ref 10.3–20.5)

## 2020-08-03 LAB — TRYPTASE: Tryptase: 2.7 ug/L (ref 2.2–13.2)

## 2020-08-03 NOTE — Telephone Encounter (Signed)
Spoke with Angie at [facility]. They are able to take patient on for home health. I advised Angie to call patient's daughter, Joni Reining, to set this up.

## 2020-08-04 ENCOUNTER — Other Ambulatory Visit: Payer: Self-pay | Admitting: *Deleted

## 2020-08-04 DIAGNOSIS — R269 Unspecified abnormalities of gait and mobility: Secondary | ICD-10-CM

## 2020-08-04 DIAGNOSIS — G629 Polyneuropathy, unspecified: Secondary | ICD-10-CM

## 2020-08-04 DIAGNOSIS — R2689 Other abnormalities of gait and mobility: Secondary | ICD-10-CM

## 2020-08-05 LAB — ALLERGENS W/TOTAL IGE AREA 2

## 2020-08-05 LAB — ALLERGEN BARLEY F6: Allergen Barley IgE: <0.1 kU/L

## 2020-08-05 LAB — ALLERGEN MILK: Milk IgE: <0.1 kU/L

## 2020-08-05 LAB — ALLERGEN, ANNATTO SEED
Annatto Seed IgE*: <0.35 kU/L (ref <0.35)
Class Interpretation: 0

## 2020-08-05 LAB — ALLERGEN, RYE, F5: Rye IgE: <0.1 kU/L

## 2020-08-05 LAB — ALLERGEN,OAT,F7: Allergen Oat IgE: <0.1 kU/L

## 2020-08-05 LAB — ALLERGEN, RED (CARMINE) DYE, RF340: F340-IgE Carmine Red Dye: <0.1 kU/L

## 2020-08-05 LAB — ALLERGEN, WHEAT, F4: Wheat IgE: <0.1 kU/L

## 2020-08-05 NOTE — Telephone Encounter (Signed)
Message from [employer] with [facility] (phone: [phone]):  "I sent them [facility], because [facility] couldn't take patient after all) the new orders this am and they said the dx on her is not a qualifying dx--maybe if the someone can add polyneuropathy to her as a dx for the [facility] that should help. Also [facility] is saying the visit note is out of their window of acceptance--they have to have the FTF note with in 60 days prior or 30 days after the start of care - her FTF is 05/06/20. So sorry--if you can set her up a FTF then that's what they need to in order to see her and they need a dx that works maybe polyneuropathy"  So it looks like patient may need to come back in for a visit.

## 2020-08-09 ENCOUNTER — Telehealth: Payer: Self-pay | Admitting: Neurology

## 2020-08-09 NOTE — Telephone Encounter (Signed)
Pt's daughter Nicole(on [place]) is asking for a call to know if pt's treatments are set for every 4 weeks instead of 5.  If so daughter states [facility] needs to be made aware so they can ship the medication to pt's home.  Joni Reining is asking for a call  At [phone]

## 2020-08-09 NOTE — Telephone Encounter (Signed)
I sent another secure message to [facility] with [facility] requesting for an update and clarifying our order for IVIG over 5 days every 4 weeks as prior.

## 2020-08-09 NOTE — Telephone Encounter (Addendum)
[facility] let me know this morning that they do not have the staffing to take patient on. We have now tried We have tried [facility], [facility], [facility], and [facility] and have been unsuccessful. I called [facility] @ [phone] and spoke with Armando Reichert. She advised me to call the central intake dept @ [phone]. I called and spoke with Vickie. Vickie states that they do not have the availability or resources to take patient on.   I called [facility] @ [phone] and spoke with Vernona Rieger. Vernona Rieger directed me to call [phone]. I spoke to a representative who states they have no staffing available.  I called Ethelle Lyon with [facility] @ [phone] and LVM requesting a call back to discuss possibly getting patient set up.

## 2020-08-09 NOTE — Telephone Encounter (Signed)
Pt's daughter Joni Reining called and LVM wanting to know if the pt's prescription has been sent in for her next infusion treatment. Please advise.

## 2020-08-09 NOTE — Telephone Encounter (Signed)
I had previously reached out to [facility] and was waiting for the orders to be sent to [place] to sign.  Dr. Daisy Blossom is currently out of the office.  I have reviewed this with Dr. Vickey Huger who has agreed to change treatments back to over 5 days every 4 weeks per daughter's request versus the previous attempt at 3 days every 5 weeks.

## 2020-08-09 NOTE — Telephone Encounter (Signed)
This is being documented in another call. See other encounter.

## 2020-08-10 NOTE — Telephone Encounter (Signed)
Sarah Whitaker, pharmacist with [facility] called to clarify the orders to return to previous IVIG treatment plan of over 5 days every 4 weeks. I confirmed and he stated the orders would be faxed over to our office right away for Dr Dohmeier to sign in Dr Trevor Mace absence.

## 2020-08-10 NOTE — Telephone Encounter (Signed)
Received written IVIG orders for 180 gram infusion over 5 days every 4 weeks. Infusion orders signed by Dr Vickey Huger and faxed back to [facility] @ [facility]. Received a receipt of confirmation.

## 2020-08-10 NOTE — Telephone Encounter (Signed)
Carmen sent a secure message back and stated she would reach back out to the pharmacy. I updated Joni Reining, pt's daughter, and she verbalized appreciation.

## 2020-08-10 NOTE — Telephone Encounter (Signed)
Sarah Whitaker returned my call and she let me know that she does not accept HMO plans. She accepts PPO plans. She states that the reason we have not been able to pair patient with a HH agency is likely because [facility] Medicare pays only about $50.00 a visit and only allows 30 minutes of time to be spent with the patient. She states she does offer out-of-pocket/cash options at $195.00 an hour. If the patient requires 8 sessions or more, the sessions will cost $150.00 each. She accepts all credit cards and care credit.   I called and relayed this information to patient's daughter Sarah Whitaker. Sarah Whitaker was very understanding and appreciative. She states that they received exercises from our office that patient will work on at home, they will just do that for now. Sarah Whitaker states that she may look at other insurance options for patient.

## 2020-08-16 DIAGNOSIS — G629 Polyneuropathy, unspecified: Secondary | ICD-10-CM | POA: Diagnosis not present

## 2020-08-22 DIAGNOSIS — G6181 Chronic inflammatory demyelinating polyneuritis: Secondary | ICD-10-CM | POA: Diagnosis not present

## 2020-08-23 DIAGNOSIS — G6181 Chronic inflammatory demyelinating polyneuritis: Secondary | ICD-10-CM | POA: Diagnosis not present

## 2020-08-24 DIAGNOSIS — G6181 Chronic inflammatory demyelinating polyneuritis: Secondary | ICD-10-CM | POA: Diagnosis not present

## 2020-08-25 ENCOUNTER — Ambulatory Visit: Payer: Medicare Other | Admitting: Neurology

## 2020-08-25 DIAGNOSIS — G6181 Chronic inflammatory demyelinating polyneuritis: Secondary | ICD-10-CM | POA: Diagnosis not present

## 2020-08-26 DIAGNOSIS — G6181 Chronic inflammatory demyelinating polyneuritis: Secondary | ICD-10-CM | POA: Diagnosis not present

## 2020-08-31 NOTE — Telephone Encounter (Signed)
Received order request for patient to have pre-infusion of 500 mL 0.9% Sodium Chloride at a rate of 500 mL/hr (or slower as tolerated by the patient). Dispense 4 week supply. Refill x PRN through current IVIG orders until 08/09/21.   Orders approved by Dr Lucia Gaskins, signed, and faxed back to  [facility]. Received a receipt of confirmation.

## 2020-09-10 DIAGNOSIS — I10 Essential (primary) hypertension: Secondary | ICD-10-CM | POA: Diagnosis not present

## 2020-09-10 DIAGNOSIS — M0579 Rheumatoid arthritis with rheumatoid factor of multiple sites without organ or systems involvement: Secondary | ICD-10-CM | POA: Diagnosis not present

## 2020-09-10 DIAGNOSIS — E118 Type 2 diabetes mellitus with unspecified complications: Secondary | ICD-10-CM | POA: Diagnosis not present

## 2020-09-10 DIAGNOSIS — E782 Mixed hyperlipidemia: Secondary | ICD-10-CM | POA: Diagnosis not present

## 2020-09-10 DIAGNOSIS — E7800 Pure hypercholesterolemia, unspecified: Secondary | ICD-10-CM | POA: Diagnosis not present

## 2020-09-10 DIAGNOSIS — M810 Age-related osteoporosis without current pathological fracture: Secondary | ICD-10-CM | POA: Diagnosis not present

## 2020-09-20 DIAGNOSIS — G6181 Chronic inflammatory demyelinating polyneuritis: Secondary | ICD-10-CM | POA: Diagnosis not present

## 2020-09-21 DIAGNOSIS — G6181 Chronic inflammatory demyelinating polyneuritis: Secondary | ICD-10-CM | POA: Diagnosis not present

## 2020-09-22 DIAGNOSIS — G629 Polyneuropathy, unspecified: Secondary | ICD-10-CM | POA: Diagnosis not present

## 2020-09-22 DIAGNOSIS — G6181 Chronic inflammatory demyelinating polyneuritis: Secondary | ICD-10-CM | POA: Diagnosis not present

## 2020-09-23 DIAGNOSIS — G6181 Chronic inflammatory demyelinating polyneuritis: Secondary | ICD-10-CM | POA: Diagnosis not present

## 2020-09-24 DIAGNOSIS — G6181 Chronic inflammatory demyelinating polyneuritis: Secondary | ICD-10-CM | POA: Diagnosis not present

## 2020-10-14 DIAGNOSIS — N649 Disorder of breast, unspecified: Secondary | ICD-10-CM | POA: Diagnosis not present

## 2020-10-14 DIAGNOSIS — I10 Essential (primary) hypertension: Secondary | ICD-10-CM | POA: Diagnosis not present

## 2020-10-18 DIAGNOSIS — G629 Polyneuropathy, unspecified: Secondary | ICD-10-CM | POA: Diagnosis not present

## 2020-10-19 ENCOUNTER — Telehealth: Payer: Self-pay | Admitting: Neurology

## 2020-10-19 ENCOUNTER — Other Ambulatory Visit: Payer: Self-pay | Admitting: Family Medicine

## 2020-10-19 DIAGNOSIS — N649 Disorder of breast, unspecified: Secondary | ICD-10-CM

## 2020-10-19 NOTE — Telephone Encounter (Signed)
Pt is asking for a call from RN to discuss her infusion treatment.

## 2020-10-20 NOTE — Telephone Encounter (Signed)
I called [facility] and nursing supervisor, Toniann Fail.  I was informed that [facility] agency has been infusing the patient since June. Unfortunately [facility] dropped the ball and got the dates mixed up thinking the patient was due next week.  I was told that if needed in the future the infusion agency can be switched by [facility].  Toniann Fail stated that they are looking to see if the patient can be seen sooner.  She will reach out to [facility].   Updated patient. She verbalized understanding and appreciation.  Her questions were answered.

## 2020-10-20 NOTE — Telephone Encounter (Signed)
I returned the pt's call. She stated the infusion company dropped the ball and she will now be 1 week late for her IVIG. Her medication arrived and she is scheduled to be infused next week. She had concerns about this and I thinkassured her I would reach out to [facility] to be sure they have the correct orders and see what happened and if they can see her sooner by chance. Patient verbalized appreciation.

## 2020-10-24 DIAGNOSIS — G6181 Chronic inflammatory demyelinating polyneuritis: Secondary | ICD-10-CM | POA: Diagnosis not present

## 2020-10-26 DIAGNOSIS — G6181 Chronic inflammatory demyelinating polyneuritis: Secondary | ICD-10-CM | POA: Diagnosis not present

## 2020-10-27 DIAGNOSIS — G6181 Chronic inflammatory demyelinating polyneuritis: Secondary | ICD-10-CM | POA: Diagnosis not present

## 2020-10-28 ENCOUNTER — Ambulatory Visit: Payer: Medicare Other | Admitting: Allergy

## 2020-10-28 DIAGNOSIS — E7800 Pure hypercholesterolemia, unspecified: Secondary | ICD-10-CM | POA: Diagnosis not present

## 2020-10-28 DIAGNOSIS — M810 Age-related osteoporosis without current pathological fracture: Secondary | ICD-10-CM | POA: Diagnosis not present

## 2020-10-28 DIAGNOSIS — I10 Essential (primary) hypertension: Secondary | ICD-10-CM | POA: Diagnosis not present

## 2020-10-28 DIAGNOSIS — E782 Mixed hyperlipidemia: Secondary | ICD-10-CM | POA: Diagnosis not present

## 2020-10-28 DIAGNOSIS — M0579 Rheumatoid arthritis with rheumatoid factor of multiple sites without organ or systems involvement: Secondary | ICD-10-CM | POA: Diagnosis not present

## 2020-10-28 DIAGNOSIS — G6181 Chronic inflammatory demyelinating polyneuritis: Secondary | ICD-10-CM | POA: Diagnosis not present

## 2020-10-28 DIAGNOSIS — E118 Type 2 diabetes mellitus with unspecified complications: Secondary | ICD-10-CM | POA: Diagnosis not present

## 2020-10-29 DIAGNOSIS — G6181 Chronic inflammatory demyelinating polyneuritis: Secondary | ICD-10-CM | POA: Diagnosis not present

## 2020-11-12 DIAGNOSIS — M25512 Pain in left shoulder: Secondary | ICD-10-CM | POA: Diagnosis not present

## 2020-11-12 DIAGNOSIS — R0602 Shortness of breath: Secondary | ICD-10-CM | POA: Diagnosis not present

## 2020-11-12 DIAGNOSIS — Z79899 Other long term (current) drug therapy: Secondary | ICD-10-CM | POA: Diagnosis not present

## 2020-11-12 DIAGNOSIS — M25552 Pain in left hip: Secondary | ICD-10-CM | POA: Diagnosis not present

## 2020-11-12 DIAGNOSIS — D649 Anemia, unspecified: Secondary | ICD-10-CM | POA: Diagnosis not present

## 2020-11-12 DIAGNOSIS — M069 Rheumatoid arthritis, unspecified: Secondary | ICD-10-CM | POA: Diagnosis not present

## 2020-11-12 DIAGNOSIS — M1990 Unspecified osteoarthritis, unspecified site: Secondary | ICD-10-CM | POA: Diagnosis not present

## 2020-11-15 DIAGNOSIS — I10 Essential (primary) hypertension: Secondary | ICD-10-CM | POA: Diagnosis not present

## 2020-11-15 DIAGNOSIS — M0579 Rheumatoid arthritis with rheumatoid factor of multiple sites without organ or systems involvement: Secondary | ICD-10-CM | POA: Diagnosis not present

## 2020-11-15 DIAGNOSIS — M810 Age-related osteoporosis without current pathological fracture: Secondary | ICD-10-CM | POA: Diagnosis not present

## 2020-11-15 DIAGNOSIS — E118 Type 2 diabetes mellitus with unspecified complications: Secondary | ICD-10-CM | POA: Diagnosis not present

## 2020-11-15 DIAGNOSIS — E7800 Pure hypercholesterolemia, unspecified: Secondary | ICD-10-CM | POA: Diagnosis not present

## 2020-11-15 DIAGNOSIS — E782 Mixed hyperlipidemia: Secondary | ICD-10-CM | POA: Diagnosis not present

## 2020-11-17 DIAGNOSIS — M1990 Unspecified osteoarthritis, unspecified site: Secondary | ICD-10-CM | POA: Diagnosis not present

## 2020-11-17 DIAGNOSIS — M25512 Pain in left shoulder: Secondary | ICD-10-CM | POA: Diagnosis not present

## 2020-11-17 DIAGNOSIS — D649 Anemia, unspecified: Secondary | ICD-10-CM | POA: Diagnosis not present

## 2020-11-17 DIAGNOSIS — M069 Rheumatoid arthritis, unspecified: Secondary | ICD-10-CM | POA: Diagnosis not present

## 2020-11-17 DIAGNOSIS — Z79899 Other long term (current) drug therapy: Secondary | ICD-10-CM | POA: Diagnosis not present

## 2020-11-22 DIAGNOSIS — G6181 Chronic inflammatory demyelinating polyneuritis: Secondary | ICD-10-CM | POA: Diagnosis not present

## 2020-11-23 DIAGNOSIS — G6181 Chronic inflammatory demyelinating polyneuritis: Secondary | ICD-10-CM | POA: Diagnosis not present

## 2020-11-24 DIAGNOSIS — G629 Polyneuropathy, unspecified: Secondary | ICD-10-CM | POA: Diagnosis not present

## 2020-11-24 DIAGNOSIS — G6181 Chronic inflammatory demyelinating polyneuritis: Secondary | ICD-10-CM | POA: Diagnosis not present

## 2020-11-25 DIAGNOSIS — G6181 Chronic inflammatory demyelinating polyneuritis: Secondary | ICD-10-CM | POA: Diagnosis not present

## 2020-11-26 DIAGNOSIS — G6181 Chronic inflammatory demyelinating polyneuritis: Secondary | ICD-10-CM | POA: Diagnosis not present

## 2020-12-07 ENCOUNTER — Other Ambulatory Visit: Payer: Medicare Other

## 2020-12-07 ENCOUNTER — Telehealth: Payer: Self-pay | Admitting: Neurology

## 2020-12-07 NOTE — Telephone Encounter (Signed)
Noted thanks °

## 2020-12-07 NOTE — Telephone Encounter (Signed)
..   Pt understands that although there may be some limitations with this type of visit, we will take all precautions to reduce any security or privacy concerns.  Pt understands that this will be treated like an in office visit and we will file with pt's insurance, and there may be a patient responsible charge related to this service. ? ?

## 2020-12-09 ENCOUNTER — Telehealth (INDEPENDENT_AMBULATORY_CARE_PROVIDER_SITE_OTHER): Payer: Medicare Other | Admitting: Neurology

## 2020-12-09 DIAGNOSIS — R202 Paresthesia of skin: Secondary | ICD-10-CM

## 2020-12-09 DIAGNOSIS — G959 Disease of spinal cord, unspecified: Secondary | ICD-10-CM

## 2020-12-09 DIAGNOSIS — R0602 Shortness of breath: Secondary | ICD-10-CM | POA: Diagnosis not present

## 2020-12-09 DIAGNOSIS — D649 Anemia, unspecified: Secondary | ICD-10-CM | POA: Diagnosis not present

## 2020-12-09 DIAGNOSIS — R269 Unspecified abnormalities of gait and mobility: Secondary | ICD-10-CM

## 2020-12-09 DIAGNOSIS — R29898 Other symptoms and signs involving the musculoskeletal system: Secondary | ICD-10-CM | POA: Diagnosis not present

## 2020-12-09 DIAGNOSIS — R200 Anesthesia of skin: Secondary | ICD-10-CM

## 2020-12-09 DIAGNOSIS — M25511 Pain in right shoulder: Secondary | ICD-10-CM | POA: Diagnosis not present

## 2020-12-09 DIAGNOSIS — M1990 Unspecified osteoarthritis, unspecified site: Secondary | ICD-10-CM | POA: Diagnosis not present

## 2020-12-09 DIAGNOSIS — G619 Inflammatory polyneuropathy, unspecified: Secondary | ICD-10-CM

## 2020-12-09 DIAGNOSIS — M25512 Pain in left shoulder: Secondary | ICD-10-CM | POA: Diagnosis not present

## 2020-12-09 DIAGNOSIS — Z79899 Other long term (current) drug therapy: Secondary | ICD-10-CM | POA: Diagnosis not present

## 2020-12-09 DIAGNOSIS — M069 Rheumatoid arthritis, unspecified: Secondary | ICD-10-CM | POA: Diagnosis not present

## 2020-12-09 DIAGNOSIS — M25552 Pain in left hip: Secondary | ICD-10-CM | POA: Diagnosis not present

## 2020-12-09 DIAGNOSIS — M12812 Other specific arthropathies, not elsewhere classified, left shoulder: Secondary | ICD-10-CM | POA: Diagnosis not present

## 2020-12-09 NOTE — Progress Notes (Signed)
[facility]    Provider:  Dr Sarah Whitaker Requesting Provider: Caren Macadam, MD Primary Care Provider:  Caren Macadam, MD  CC:  Neuropathy  Virtual Visit via Video Note  I connected with Sarah Whitaker at  1:30 PM EDT by a video enabled telemedicine application and verified that I am speaking with the correct person using two identifiers.  Location: Patient: home Provider: office   I discussed the limitations of evaluation and management by telemedicine and the availability of in person appointments. The patient expressed understanding and agreed to proceed.   Follow Up Instructions:    I discussed the assessment and treatment plan with the patient. The patient was provided an opportunity to ask questions and all were answered. The patient agreed with the plan and demonstrated an understanding of the instructions.   The patient was advised to call back or seek an in-person evaluation if the symptoms worsen or if the condition fails to improve as anticipated.  I provided over 30 minutes of non-face-to-face time during this encounter.   Sarah Beam, MD   12/09/2020: Patient reports that when we tried pushing out her IVIG to 5 or 6 weeks she progressively worsened so her IVIG was brought back to every 3 weeks.  She is not having as many side effects because she gets a fluid bolus before the IVIG.  She still having a lot of difficulty with weakness in her legs and walking and stamina, she is going to [facility] physical therapy on [address] I will contact them and put in an additional order to not only work with her left arm but also to help with strengthening, stamina and and gait.  She has stiffness in her hands but no sensory changes likely her rheumatoid arthritis.  She has left shoulder pain and she has been getting injections the pain does not radiate from her neck and it does not radiate down her arm it appears to be very localized to the left  shoulder so it does not sound as though she has any cervical radiculopathy and no sensory changes in the hands very unlikely carpal tunnel syndrome.  But given she continues to have weakness and sensory changes in her lower extremities I think a repeat of the MRI lumbar spine adding an MRI of the thoracic spine as well would be prudent.  She has had extensive testing in the past, the next time she is here we should order an IFE with SPEP repeat.  f/u Thursday afternoon next appt - will call  Patient complains of symptoms per HPI as well as the following symptoms: left shoulder pain s/p injections . Pertinent negatives and positives per HPI. All others negative   05/06/2020: Patient is here with daughter. She still feels she needs IVIG. We discussed pushing the IVIG out to 5 weeks to see how she does nd then maybe 6 weeks if possible to get her off of the IVIG. Also she gets it over 5 days, try to reduce to 3 days to make it less cumbersome for her. She reports tingling int he fingers, may consider emg/ncs and could check one leg as well and the uppers. Home PT/OT to help with gait and strength.   Interval history May 06, 2020: Patient has been following in our office and having regular appointments with Sarah Presto, NP, today I'm seeing her today. She did get a port and is doing well with this. She has been followed by PCP closely. She has been  more sedentary. But overall she has been doing very well for polyneuropathy. When last seen by Sarah Whitaker in August 2021 she had a fall but it was not related to her polyneuropathy and we recommended physical therapy for strength and gait evaluation.  Interval update 02/15/2019:  She is here for followup on neuropathy and IVIG: Her vertigo is better. She went to PT for vestibular therapy and the vertigo was better. The second time she went her pulse went up so she did not go back. Overall she continues to improve with neuropathy and vertigo, she feels a little weak  after IVIG and it takes some time to feel better, it take her 3 days to get back to herself, bu tthen she feels great. She is a hard stick, since we will be continuing this we will insert a  port, it is terrible getting the injections per patient and she would like a port. No difference being off of the statin, restart. Feels better on iron. She worries about her memory, no family history Discussed normal cognitive aging. Will keep IVIG every month over 5 days, discussed increasing time in between IVIG treatments from 4 to 5 weeks when she is read and also decreasing how many days it takes to get her IVIG from 5 to 3 but right now will leave it as is.     HPI:  Sarah Whitaker is a 80 y.o. female here as requested by Sarah Macadam, MD for neuropathy.  Past medical history diabetes, rheumatoid arthritis, hyperlipidemia, hypertension. 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 She can walk now, not using the cane as much, She never had pain more weakness in the legs, started acutely confirmed by extensive workup by neurology in [facility]. She still feels weak but significantly improved. She never had sensory changes, all weakness, in the legs but not in the hands and did not affect the hands. Denies any numbness. Ankles stil stiff. Prior to acute symptoms no changes, no changes in medications, no falls. Doing well otherwise.   Reviewed notes, labs and imaging from outside physicians, which showed:  Reviewed notes from [facility] in [place].  Patient is new to the area and is establishing care.  Patient presented with bilateral acute leg weakness.  She has a past medical history of hyperlipidemia, diabetes and rheumatoid arthritis who presented for evaluation of new weakness in March 2020.  In January 2020 she developed relatively acute to subacute onset of weakness in both lower extremities.   This began without any clear provocation but is worsened over the month.  Now experiencing moderately severe constant and chronic weakness of both lower extremities.  Her weakness is worse when she is on her feet and fully goes away.  She is having to use cane to walk and prior to January she was able to walk without any assistance.  No family history of similar things.  She has no prior history of similar symptoms.  No changes in sensation such as weakness, tingling or numbness.  No significant pain in the back of the legs themselves.  Right side appears more affected than the left but both sides are affected.  She underwent a laboratory evaluation largely normal with a CK of 325.  She is on lovastatin and methotrexate.  She is retired.  She is right-handed.  Neurologic exam was unremarkable except for 4- out of 5 strength in the bilateral tibialis anterior, no atrophy  or fasciculations noted, intact to light touch, pinprick and vibration, no dysmetria, normal toe and heel walking normal tandem great, absent at ankles but otherwise DTRs 2+.  EMG nerve conduction study was obtained.  So was MRI of the lumbar spine.  She is on methotrexate for rheumatoid arthritis.  She was continued on lovastatin.  Personally reviewed MRI of the low spine which showed as above noted.  Reviewed EMG nerve conduction study data the bilateral tibialis anterior muscles and medial gastroc muscles demonstrated 2+ to 3+ insertional activity with markedly reduced recruitment and increased motor unit amplitude and duration.  The bilateral vastus lateralis and vastus medialis muscles demonstrated rare insertional activity with mildly increased motor unit amplitudes.  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  CTA of the head showed no acute  intracranial abnormalities, mild chronic senescent changes, remote left craniotomy and underlying aneurysm repair changes.  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  EMG nerve conduction study showed severe, active in chronic sensorimotor polyneuropathy, inflammatory or autoimmune, toxic or metabolic, or infectious etiology would appear most likely.  A lumbar puncture was ordered and lab work was ordered.  Lumbar puncture showed slightly elevated glucose at 57.  Labs ordered included ANA screen positive titer and pattern 1-1 60 speckled, double-stranded DNA 48 c-ANCA negative, p-ANCA negative, CRP 2.1, ESR 78, serum immunoelectrophoresis showed a questionable faint monoclonal band present in the gamma region of IgG kappa, Lyme disease negative, paraneoplastic antibody found neuronal ACH R ganglionic neuronal antibodies high. Review of Systems: Patient complains of symptoms per HPI as well as the following symptoms: imbalance, needs walking aid.  Pertinent negatives and positives per HPI. All others negative    Social History   Socioeconomic History   Marital status: Divorced    Spouse name: Not on file   Number of children: 2   Years of education: Not on file   Highest education level: Not on file  Occupational History    Comment: retired  Tobacco Use   Smoking status: Former   Smokeless tobacco: Never  Substance and Sexual Activity   Alcohol use: Never   Drug use: Never   Sexual activity: Not Currently  Other Topics Concern   Not on file  Social History Narrative   Lives at home alone   Right handed   Caffeine: 1/2 and 1/2 coffee daily 1 cup   Social Determinants of Health   Financial Resource Strain: Not on file  Food Insecurity: Not on file  Transportation Needs: Not on file  Physical Activity: Not on file  Stress: Not on file  Social Connections: Not on file  Intimate Partner Violence: Not on file    Family History  Problem Relation Age of Onset   Aneurysm Mother        Brain   Heart  disease Father 59   Aneurysm Sister        Brain   Neuromuscular disorder Neg Hx     Past Medical History:  Diagnosis Date   Anxiety    Depression    Eczema    Headache    Hx of diabetes mellitus    pt states she was told she didn't have to take anything anymore because of her A1C.   Hyperlipidemia    Hypertension    Neuropathy    Rheumatoid aortitis  Vertigo     Patient Active Problem List   Diagnosis Date Noted   Murmur 04/01/2019   SOB (shortness of breath) 04/01/2019   Educated about COVID-19 virus infection 04/01/2019   Polyneuropathy 12/16/2018   Long-term current use of intravenous immunoglobulin (IVIG) 12/16/2018   Obesity (BMI 30-39.9) 12/16/2018    Past Surgical History:  Procedure Laterality Date   APPENDECTOMY     BRAIN SURGERY     Repair of an aneurysm   IR IMAGING GUIDED PORT INSERTION  01/01/2019   SHOULDER SURGERY Right    TONSILLECTOMY      Current Outpatient Medications  Medication Sig Dispense Refill   alendronate (FOSAMAX) 70 MG tablet Take 70 mg by mouth once a week.     ALPRAZolam (XANAX) 0.5 MG tablet Take by mouth.     Ascorbic Acid (VITAMIN C PO) Take 1,060 mg by mouth daily.     ASPIRIN 81 PO Take 81 mg by mouth daily.     atorvastatin (LIPITOR) 20 MG tablet Take 20 mg by mouth daily.     Azelastine-Fluticasone (DYMISTA) 137-50 MCG/ACT SUSP Place 1 spray into the nose in the morning and at bedtime. For nasal congestion and drainage 23 g 5   b complex vitamins tablet Take 2 tablets by mouth daily.      calcium carbonate (TUMS - DOSED IN MG ELEMENTAL CALCIUM) 500 MG chewable tablet Chew 1 tablet by mouth as needed.     folic acid (FOLVITE) 1 MG tablet Take 1 mg by mouth daily.     Immune Globulin, Human, (GAMMAGARD IV) Inject into the vein.     lidocaine (LMX) 4 % cream Apply 1 application topically as needed. Once monthly for port     lisinopril (ZESTRIL) 20 MG tablet      lisinopril-hydrochlorothiazide (ZESTORETIC) 20-25 MG tablet  Take 0.5 tablets by mouth daily.     meclizine (ANTIVERT) 25 MG tablet Take 25 mg by mouth 2 (two) times daily as needed.     methotrexate (RHEUMATREX) 2.5 MG tablet Take 15 mg by mouth once a week. Caution:Chemotherapy. Protect from light. Take 5 tabs weekly; Fridays     Multiple Vitamin (MULTIVITAMIN ADULT PO) Take 1 tablet by mouth daily. "Living Green" tablet     Olopatadine HCl 0.2 % SOLN Apply 1 drop to eye daily as needed (itchy, watery eyes). 2.5 mL 5   ondansetron (ZOFRAN ODT) 4 MG disintegrating tablet Take 1 tablet (4 mg total) by mouth every 8 (eight) hours as needed for nausea or vomiting. 12 tablet 1   ONETOUCH VERIO test strip 1 each daily.     No current facility-administered medications for this visit.    Allergies as of 12/09/2020 - Review Complete 07/29/2020  Allergen Reaction Noted   Red dye Itching 12/25/2018   Latex Rash 09/26/2018   Penicillins Rash 09/26/2018   Sulfa antibiotics Rash 09/26/2018   Tetanus toxoids Rash 09/26/2018    Vitals: There were no vitals taken for this visit. Last Weight:  Wt Readings from Last 1 Encounters:  07/29/20 187 lb 9.6 oz (85.1 kg)   Last Height:   Ht Readings from Last 1 Encounters:  07/29/20 5' 2.5" (1.588 m)   Video visit December 09, 2020: Appears comfortable, obese, well-groomed, speech is normal fluent and spontaneous, she is oriented person place and time and is a good historian with recent and remote memory intact, extraocular movements are intact, she reports normal trigeminal sensation, face appears symmetrical, voice is normal, moving all  extremities antigravity.   PRIOR exam (today on video) Exam: Gen: NAD, conversant, well nourised, obese, well groomed                     CV: RRR, no MRG. No Carotid Bruits. No peripheral edema, warm, nontender Eyes: Conjunctivae clear without exudates or hemorrhage  Neuro: no changes Detailed Neurologic Exam  Speech:    Speech is normal; fluent and spontaneous with normal  comprehension.  Cognition:    The patient is oriented to person, place, and time;     recent and remote memory intact;     language fluent;     normal attention, concentration,     fund of knowledge Cranial Nerves:    The pupils are equal, round, and reactive to light.   Visual fields are full to finger confrontation. Extraocular movements are intact. Trigeminal sensation is intact and the muscles of mastication are normal. The face is symmetric. The palate elevates in the midline. Hearing intact. Voice is normal. Shoulder shrug is normal. The tongue has normal motion without fasciculations.   Gait: Good arm swing, short steps and slightly wide based but erect and balance stable   Motor Observation:   Mouth movements Tone:    Normal muscle tone.    Posture:    Posture is normal. normal erect    Strength: Mild prx weakness hip flexion and delt,biceps,triceps 4/5         Reflex Exam:  DTR's:    Absent AJs. Trace patellars. 1+ biceps.  Toes:    The toes are equiv bilaterally.   Clonus:    Clonus is absent.    Assessment/Plan: 81 year old patient with a past medical history of diabetes, hyperlipidemia, hypertension, rheumatoid arthritis on methotrexate who was previously managed by neurology for acute to subacute onset of weakness in the legs, EMG nerve conduction study demonstrated a relatively severe, active and chronic sensorimotor polyneuropathy, and inflammatory/autoimmune/toxic/metabolic and infectious work-up was completed which revealed elevated ESR CRP, positive ANA and weakly positive SPEP IgG likely related to her known rheumatoid arthritis.  Also found to have a mildly elevated a CHR ganglionic neuronal antibody.  Her LP showed elevated protein of 57 but otherwise normal.  She was treated with IVIG.   Continues to have leg weakness, distal neuropathy, gait abnormality. Will order MRI Thoracic and Lumbar spine.Tried to lengthen time between IVIG treatments which left her  weaker. Ordered a port in the past due to poor IV access.   She is already participating in PT for left shoulder pain at [facility] physical therapy on [address] I will  put in an additional order to not only work with her left arm(shoulder pain) but also to help with lower extremity strengthening, stamina and and gait. Please send this referral and my note to [facility] physical therapy on [address]  EMG/NCS: 06/07/2020: There is electrophysiologic evidence for a severe distal, length-dependent, axonal, predominantly-motor polyneuropathy with acute/ongoing denervation and chronic neurogenic changes in distal muscles of the lower extremities.      Orders Placed This Encounter  Procedures   MR THORACIC SPINE WO CONTRAST   MR LUMBAR SPINE WO CONTRAST   Multiple Myeloma Panel (SPEP&IFE w/QIG)   Ambulatory referral to Physical Therapy       Cc: Sarah Macadam, MD,  Sarina Ill, MD  [facility] [address] [place], [place] [address]  Phone [phone] Fax [phone]

## 2020-12-12 ENCOUNTER — Telehealth: Payer: Self-pay | Admitting: Neurology

## 2020-12-12 ENCOUNTER — Encounter: Payer: Self-pay | Admitting: Neurology

## 2020-12-12 NOTE — Patient Instructions (Signed)
MRI Lumbar and Thoracic Spine

## 2020-12-12 NOTE — Telephone Encounter (Signed)
Would either of you mind please calling patient and scheduling her for a follow-up examination in the office in 4 months, she prefers a Thursday afternoon.  Thanks.

## 2020-12-13 ENCOUNTER — Telehealth: Payer: Self-pay | Admitting: Neurology

## 2020-12-13 NOTE — Telephone Encounter (Signed)
[employer] medicare order sent to GI, [facility] they will reach out to the patient to schedule.  

## 2020-12-13 NOTE — Telephone Encounter (Signed)
Sent PT referral to [facility] PT on [address] ph #[phone]. Patient is scheduled for Wednesday at 5:30 pm.

## 2020-12-15 DIAGNOSIS — M6281 Muscle weakness (generalized): Secondary | ICD-10-CM | POA: Diagnosis not present

## 2020-12-15 DIAGNOSIS — M79602 Pain in left arm: Secondary | ICD-10-CM | POA: Diagnosis not present

## 2020-12-17 ENCOUNTER — Other Ambulatory Visit: Payer: Self-pay | Admitting: Family Medicine

## 2020-12-17 ENCOUNTER — Other Ambulatory Visit: Payer: Self-pay

## 2020-12-17 ENCOUNTER — Ambulatory Visit
Admission: RE | Admit: 2020-12-17 | Discharge: 2020-12-17 | Disposition: A | Discharge status: Home / Self Care | Payer: Medicare Other | Source: Ambulatory Visit | Attending: Family Medicine | Admitting: Family Medicine

## 2020-12-17 DIAGNOSIS — N649 Disorder of breast, unspecified: Secondary | ICD-10-CM

## 2020-12-17 DIAGNOSIS — N6012 Diffuse cystic mastopathy of left breast: Secondary | ICD-10-CM | POA: Diagnosis not present

## 2020-12-17 DIAGNOSIS — R928 Other abnormal and inconclusive findings on diagnostic imaging of breast: Secondary | ICD-10-CM | POA: Diagnosis not present

## 2020-12-20 DIAGNOSIS — G629 Polyneuropathy, unspecified: Secondary | ICD-10-CM | POA: Diagnosis not present

## 2020-12-21 DIAGNOSIS — G629 Polyneuropathy, unspecified: Secondary | ICD-10-CM | POA: Diagnosis not present

## 2020-12-22 DIAGNOSIS — G629 Polyneuropathy, unspecified: Secondary | ICD-10-CM | POA: Diagnosis not present

## 2020-12-23 DIAGNOSIS — G629 Polyneuropathy, unspecified: Secondary | ICD-10-CM | POA: Diagnosis not present

## 2020-12-24 DIAGNOSIS — G629 Polyneuropathy, unspecified: Secondary | ICD-10-CM | POA: Diagnosis not present

## 2021-01-07 DIAGNOSIS — I10 Essential (primary) hypertension: Secondary | ICD-10-CM | POA: Diagnosis not present

## 2021-01-07 DIAGNOSIS — L309 Dermatitis, unspecified: Secondary | ICD-10-CM | POA: Diagnosis not present

## 2021-01-14 IMAGING — CT CT HEAD W/O CM
3 series · 15 of 47 positions shown, 18 images · non-contrast
Comparison: None.

CLINICAL DATA: Dizziness.

EXAM:
CT HEAD WITHOUT CONTRAST
TECHNIQUE: Contiguous axial images were obtained from the base of the skull
through the vertex without intravenous contrast.

[Series 3: head 5.0 h30s · axial · 0.45mm/px · z∈[-85,+60]mm · 9 of 35 slices shown, 12 images]
[im 3/35  brain]
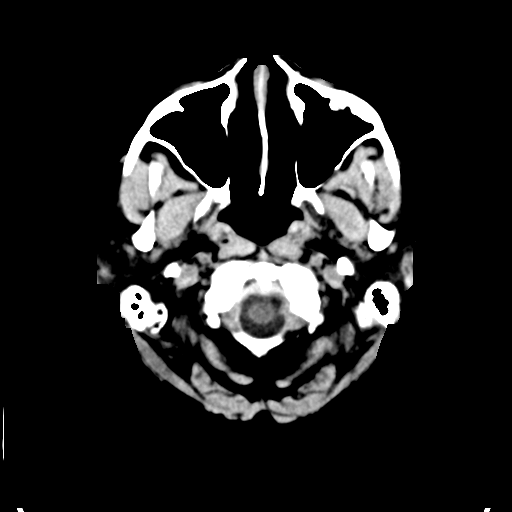
[im 3/35  bone]
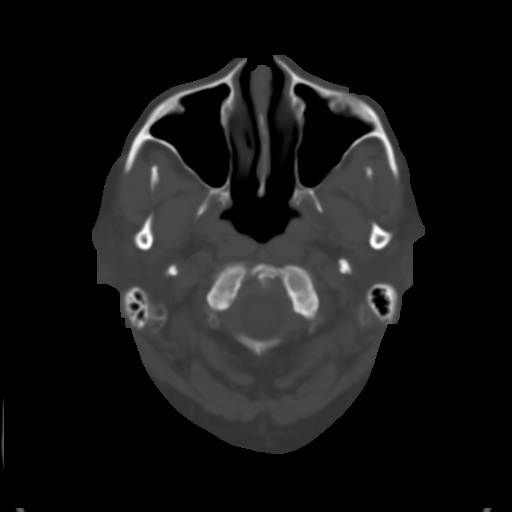
[im 6/35  brain]
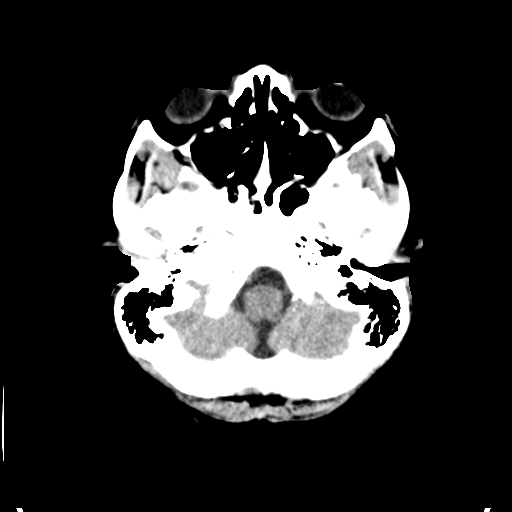
[im 10/35  brain]
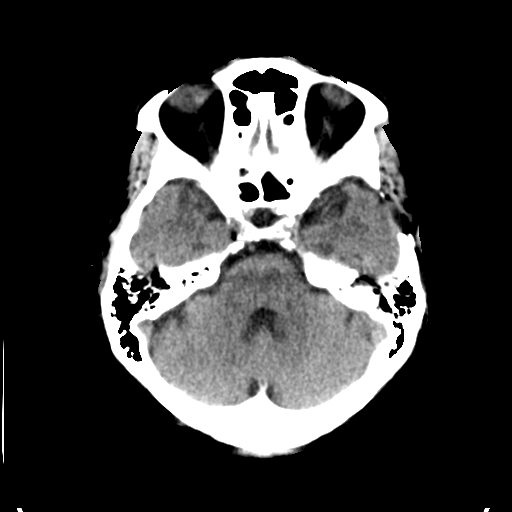
[im 13/35  brain]
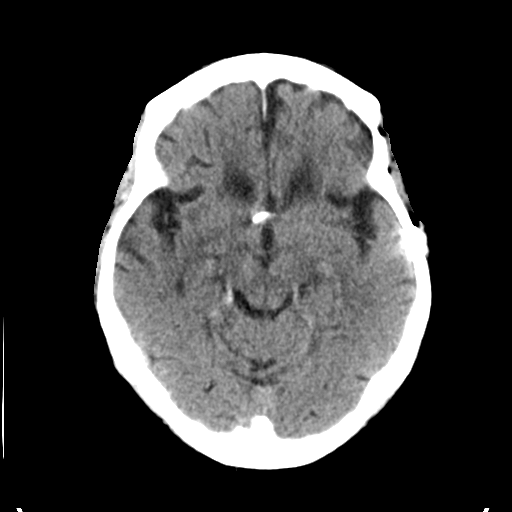
[im 18/35  brain]
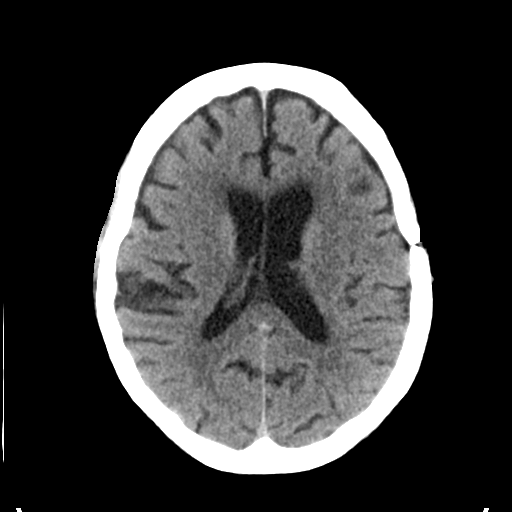
[im 18/35  bone]
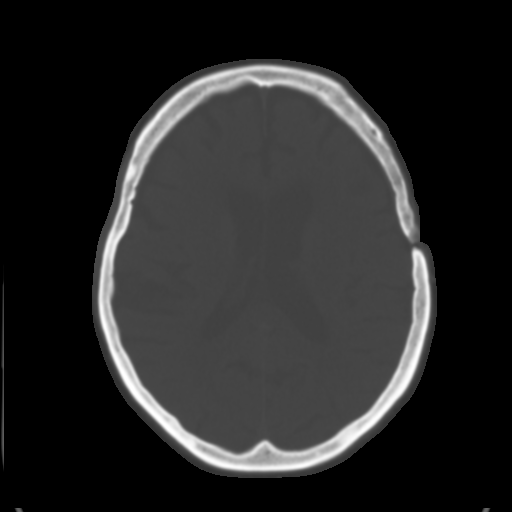
[im 22/35  brain]
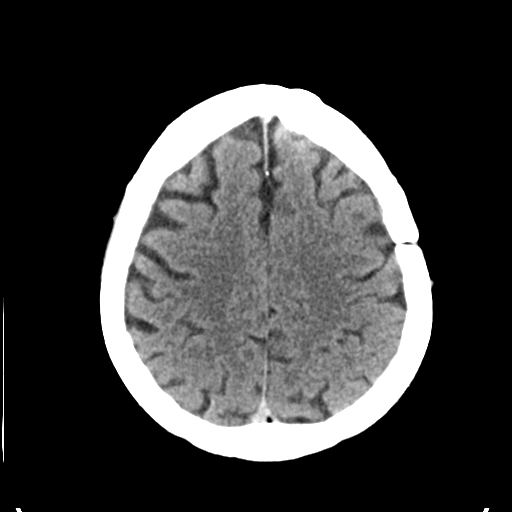
[im 25/35  brain]
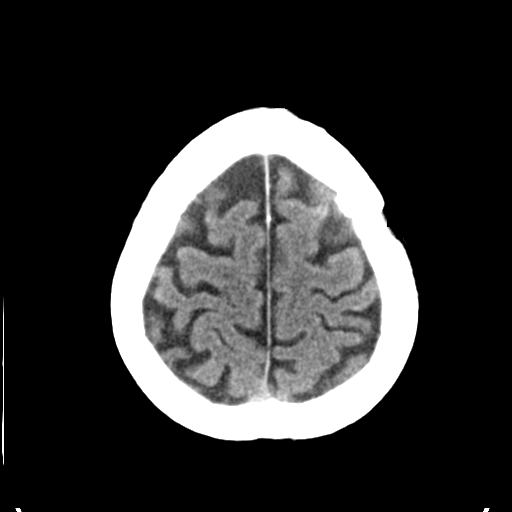
[im 29/35  brain]
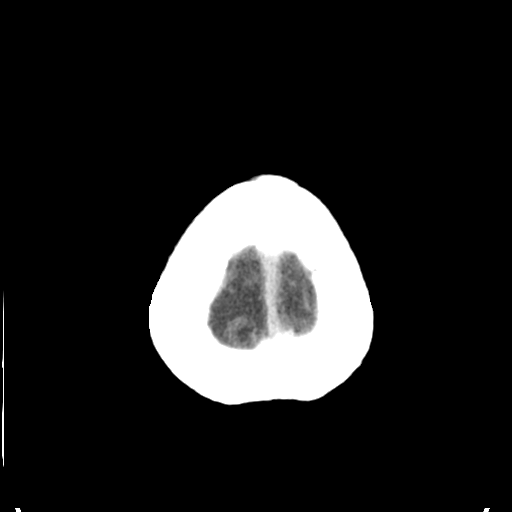
[im 32/35  brain]
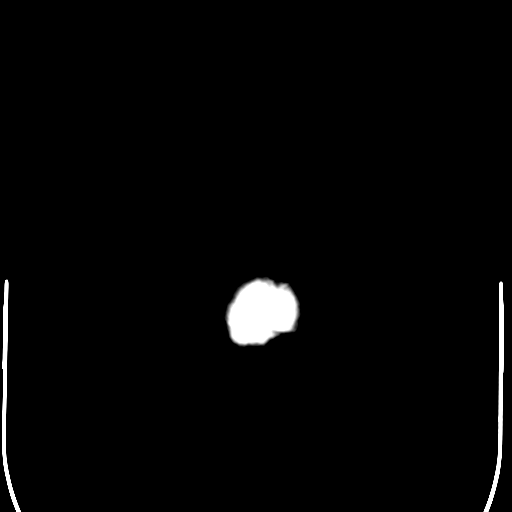
[im 32/35  bone]
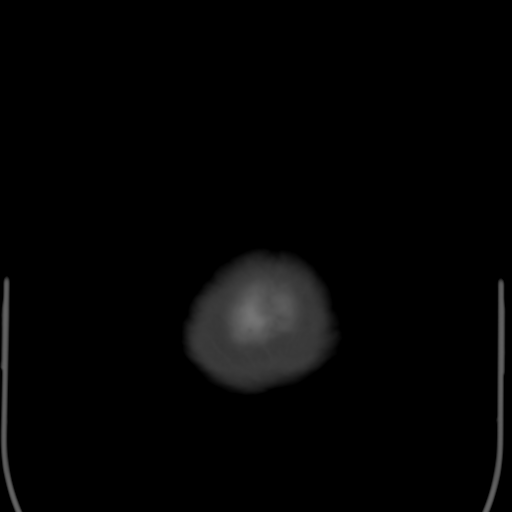

[Series 5: head 3.0 mpr cor · coronal · 0.33mm/px · 3 of 72 slices shown]
[im 24/72  brain]
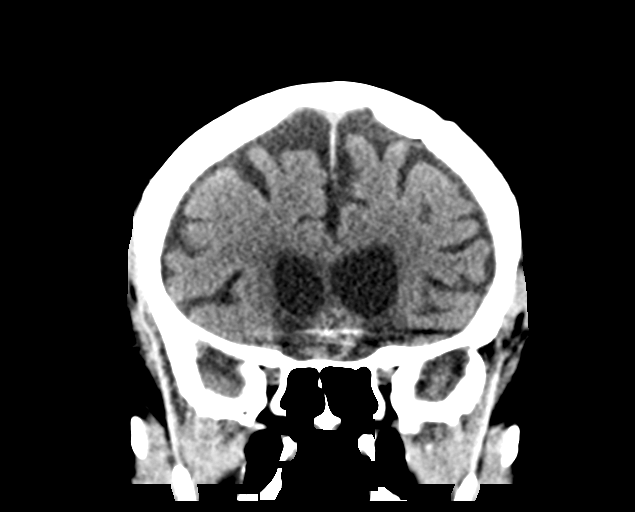
[im 32/72  brain]
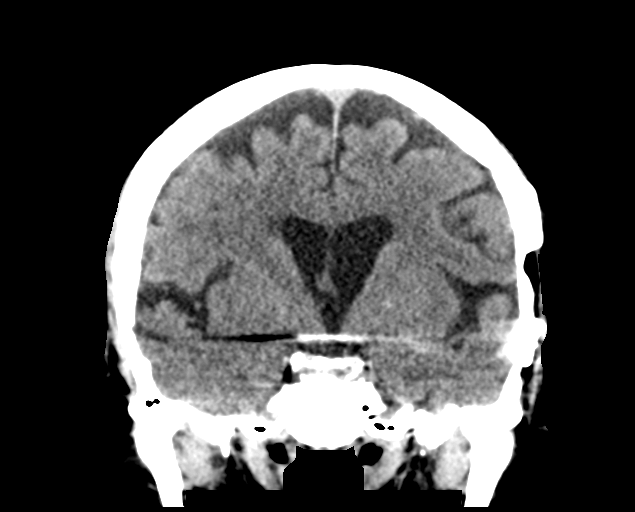
[im 40/72  brain]
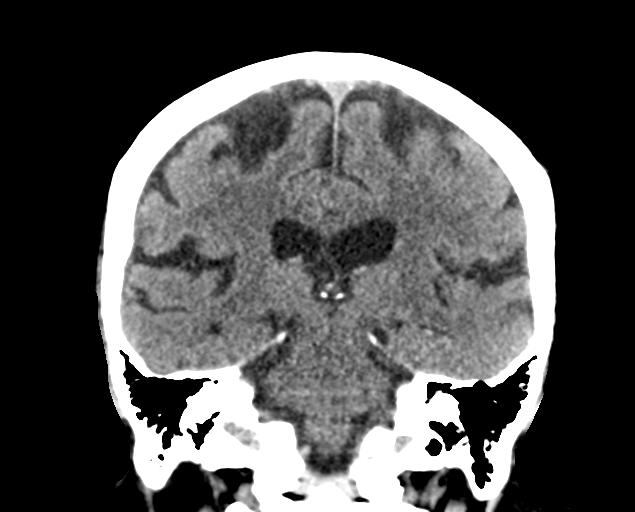

[Series 6: head 3.0 mpr sag · sagittal · 0.33mm/px · 3 of 60 slices shown]
[im 20/60  brain]
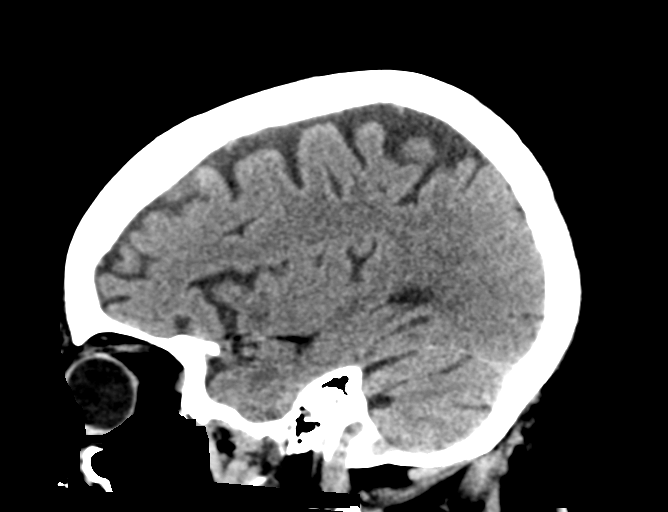
[im 30/60  brain]
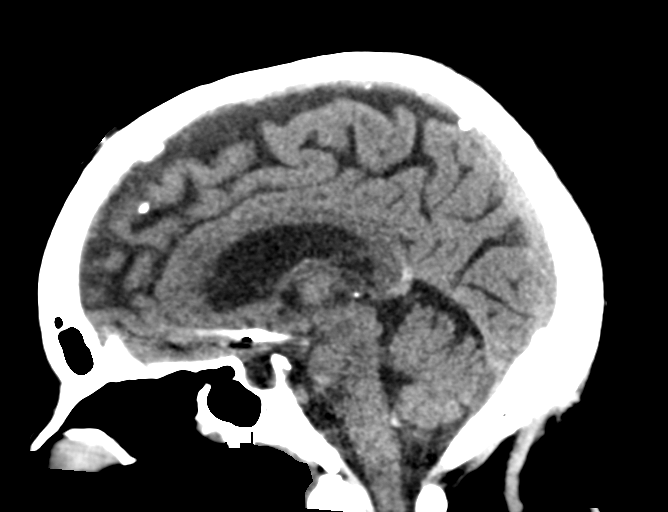
[im 40/60  brain]
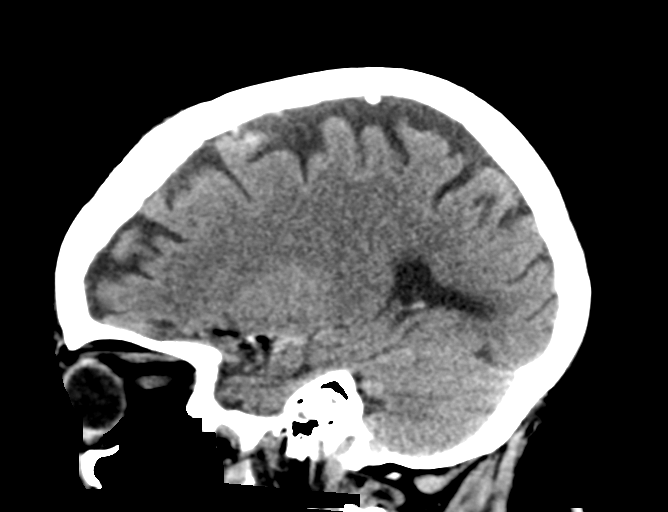

[15 of 47 positions shown; findings below may reference images not displayed]

FINDINGS: Brain: No evidence of acute infarction, hemorrhage, hydrocephalus,
extra-axial collection or mass lesion/mass effect. Signs of probable
clipping of an aneurysm in the suprasellar region. This is
associated with left frontotemporal craniotomy changes.

Vascular: Signs of presumed aneurysm clipping otherwise normal
appearance of vascular structures on noncontrast evaluation with
scattered atherosclerotic changes.

Skull: No acute finding. Signs of left frontotemporal craniotomy
presumably for aneurysm clipping as described.

Sinuses/Orbits: No acute finding.

Other: None.
IMPRESSION: 1. No acute intracranial abnormality by noncontrast CT.
2. Left frontotemporal craniotomy with signs of aneurysm clipping
and mild associated left temporal encephalomalacia.

## 2021-01-17 DIAGNOSIS — G629 Polyneuropathy, unspecified: Secondary | ICD-10-CM | POA: Diagnosis not present

## 2021-01-17 DIAGNOSIS — G6181 Chronic inflammatory demyelinating polyneuritis: Secondary | ICD-10-CM | POA: Diagnosis not present

## 2021-01-18 DIAGNOSIS — G6181 Chronic inflammatory demyelinating polyneuritis: Secondary | ICD-10-CM | POA: Diagnosis not present

## 2021-01-19 DIAGNOSIS — G6181 Chronic inflammatory demyelinating polyneuritis: Secondary | ICD-10-CM | POA: Diagnosis not present

## 2021-01-20 DIAGNOSIS — G6181 Chronic inflammatory demyelinating polyneuritis: Secondary | ICD-10-CM | POA: Diagnosis not present

## 2021-01-21 DIAGNOSIS — G6181 Chronic inflammatory demyelinating polyneuritis: Secondary | ICD-10-CM | POA: Diagnosis not present

## 2021-02-14 DIAGNOSIS — G6181 Chronic inflammatory demyelinating polyneuritis: Secondary | ICD-10-CM | POA: Diagnosis not present

## 2021-02-15 DIAGNOSIS — G6181 Chronic inflammatory demyelinating polyneuritis: Secondary | ICD-10-CM | POA: Diagnosis not present

## 2021-02-16 DIAGNOSIS — G6181 Chronic inflammatory demyelinating polyneuritis: Secondary | ICD-10-CM | POA: Diagnosis not present

## 2021-02-17 DIAGNOSIS — G6181 Chronic inflammatory demyelinating polyneuritis: Secondary | ICD-10-CM | POA: Diagnosis not present

## 2021-02-18 DIAGNOSIS — G6181 Chronic inflammatory demyelinating polyneuritis: Secondary | ICD-10-CM | POA: Diagnosis not present

## 2021-02-23 DIAGNOSIS — R6889 Other general symptoms and signs: Secondary | ICD-10-CM | POA: Diagnosis not present

## 2021-02-23 DIAGNOSIS — R0902 Hypoxemia: Secondary | ICD-10-CM | POA: Diagnosis not present

## 2021-02-23 DIAGNOSIS — Z743 Need for continuous supervision: Secondary | ICD-10-CM | POA: Diagnosis not present

## 2021-02-23 DIAGNOSIS — I10 Essential (primary) hypertension: Secondary | ICD-10-CM | POA: Diagnosis not present

## 2021-02-23 DIAGNOSIS — G4489 Other headache syndrome: Secondary | ICD-10-CM | POA: Diagnosis not present

## 2021-02-24 ENCOUNTER — Emergency Department (HOSPITAL_COMMUNITY): Payer: Medicare Other

## 2021-02-24 ENCOUNTER — Encounter (HOSPITAL_COMMUNITY): Payer: Self-pay | Admitting: *Deleted

## 2021-02-24 ENCOUNTER — Other Ambulatory Visit: Payer: Self-pay

## 2021-02-24 ENCOUNTER — Emergency Department (HOSPITAL_COMMUNITY)
Admission: EM | Admit: 2021-02-24 | Discharge: 2021-02-24 | Disposition: A | Discharge status: Home / Self Care | Payer: Medicare Other | Attending: Emergency Medicine | Admitting: Emergency Medicine

## 2021-02-24 DIAGNOSIS — E1142 Type 2 diabetes mellitus with diabetic polyneuropathy: Secondary | ICD-10-CM | POA: Diagnosis not present

## 2021-02-24 DIAGNOSIS — Z91040 Latex allergy status: Secondary | ICD-10-CM | POA: Diagnosis not present

## 2021-02-24 DIAGNOSIS — R0602 Shortness of breath: Secondary | ICD-10-CM | POA: Insufficient documentation

## 2021-02-24 DIAGNOSIS — I6523 Occlusion and stenosis of bilateral carotid arteries: Secondary | ICD-10-CM | POA: Diagnosis not present

## 2021-02-24 DIAGNOSIS — Z7982 Long term (current) use of aspirin: Secondary | ICD-10-CM | POA: Diagnosis not present

## 2021-02-24 DIAGNOSIS — I10 Essential (primary) hypertension: Secondary | ICD-10-CM | POA: Diagnosis not present

## 2021-02-24 DIAGNOSIS — I159 Secondary hypertension, unspecified: Secondary | ICD-10-CM | POA: Diagnosis not present

## 2021-02-24 DIAGNOSIS — R42 Dizziness and giddiness: Secondary | ICD-10-CM | POA: Diagnosis not present

## 2021-02-24 DIAGNOSIS — Z79899 Other long term (current) drug therapy: Secondary | ICD-10-CM | POA: Insufficient documentation

## 2021-02-24 DIAGNOSIS — R519 Headache, unspecified: Secondary | ICD-10-CM | POA: Diagnosis not present

## 2021-02-24 DIAGNOSIS — Z87891 Personal history of nicotine dependence: Secondary | ICD-10-CM | POA: Diagnosis not present

## 2021-02-24 DIAGNOSIS — Z8616 Personal history of COVID-19: Secondary | ICD-10-CM | POA: Insufficient documentation

## 2021-02-24 LAB — COMPREHENSIVE METABOLIC PANEL
ALT: 28 U/L (ref 0–44)
AST: 38 U/L (ref 15–41)
Albumin: 2.9 g/dL — ABNORMAL LOW (ref 3.5–5.0)
Alkaline Phosphatase: 67 U/L (ref 38–126)
Anion gap: 6 (ref 5–15)
BUN: 11 mg/dL (ref 8–23)
CO2: 26 mmol/L (ref 22–32)
Calcium: 8.8 mg/dL — ABNORMAL LOW (ref 8.9–10.3)
Chloride: 100 mmol/L (ref 98–111)
Creatinine, Ser: 0.62 mg/dL (ref 0.44–1.00)
GFR, Estimated: >60 mL/min (ref >60)
Glucose, Bld: 114 mg/dL — ABNORMAL HIGH (ref 70–99)
Potassium: 4 mmol/L (ref 3.5–5.1)
Sodium: 132 mmol/L — ABNORMAL LOW (ref 135–145)
Total Bilirubin: 1.3 mg/dL — ABNORMAL HIGH (ref 0.3–1.2)
Total Protein: 9 g/dL — ABNORMAL HIGH (ref 6.5–8.1)

## 2021-02-24 LAB — CBC WITH DIFFERENTIAL/PLATELET
Abs Immature Granulocytes: 0.11 10*3/uL — ABNORMAL HIGH (ref 0.00–0.07)
Basophils Absolute: 0 10*3/uL (ref 0.0–0.1)
Basophils Relative: 0 %
Eosinophils Absolute: 0.1 10*3/uL (ref 0.0–0.5)
Eosinophils Relative: 2 %
HCT: 30.5 % — ABNORMAL LOW (ref 36.0–46.0)
Hemoglobin: 10.1 g/dL — ABNORMAL LOW (ref 12.0–15.0)
Immature Granulocytes: 1 %
Lymphocytes Relative: 27 %
Lymphs Abs: 2.3 10*3/uL (ref 0.7–4.0)
MCH: 34.6 pg — ABNORMAL HIGH (ref 26.0–34.0)
MCHC: 33.1 g/dL (ref 30.0–36.0)
MCV: 104.5 fL — ABNORMAL HIGH (ref 80.0–100.0)
Monocytes Absolute: 1.1 10*3/uL — ABNORMAL HIGH (ref 0.1–1.0)
Monocytes Relative: 13 %
Neutro Abs: 4.7 10*3/uL (ref 1.7–7.7)
Neutrophils Relative %: 57 %
Platelets: 258 10*3/uL (ref 150–400)
RBC: 2.92 MIL/uL — ABNORMAL LOW (ref 3.87–5.11)
RDW: 21.1 % — ABNORMAL HIGH (ref 11.5–15.5)
WBC: 8.4 10*3/uL (ref 4.0–10.5)
nRBC: 0.4 % — ABNORMAL HIGH (ref 0.0–0.2)

## 2021-02-24 LAB — LIPASE, BLOOD: Lipase: 57 U/L — ABNORMAL HIGH (ref 11–51)

## 2021-02-24 LAB — URINALYSIS, ROUTINE W REFLEX MICROSCOPIC
Bilirubin Urine: NEGATIVE
Glucose, UA: NEGATIVE mg/dL
Hgb urine dipstick: NEGATIVE
Ketones, ur: NEGATIVE mg/dL
Leukocytes,Ua: NEGATIVE
Nitrite: NEGATIVE
Protein, ur: NEGATIVE mg/dL
Specific Gravity, Urine: 1.014 (ref 1.005–1.030)
pH: 6 (ref 5.0–8.0)

## 2021-02-24 LAB — TROPONIN I (HIGH SENSITIVITY)
Troponin I (High Sensitivity): 7 ng/L (ref <18)
Troponin I (High Sensitivity): 8 ng/L (ref <18)

## 2021-02-24 LAB — BRAIN NATRIURETIC PEPTIDE: B Natriuretic Peptide: 32.2 pg/mL (ref 0.0–100.0)

## 2021-02-24 MED ORDER — SODIUM CHLORIDE 0.9 % IV BOLUS
500.0000 mL | Freq: Once | INTRAVENOUS | Status: AC
  Administered 2021-02-24: 11:00:00 500 mL via INTRAVENOUS

## 2021-02-24 MED ORDER — DIPHENHYDRAMINE HCL 50 MG/ML IJ SOLN
12.5000 mg | Freq: Once | INTRAMUSCULAR | Status: AC
  Administered 2021-02-24: 11:00:00 12.5 mg via INTRAVENOUS
  Filled 2021-02-24: qty 1

## 2021-02-24 MED ORDER — KETOROLAC TROMETHAMINE 15 MG/ML IJ SOLN
15.0000 mg | Freq: Once | INTRAMUSCULAR | Status: AC
  Administered 2021-02-24: 11:00:00 15 mg via INTRAVENOUS
  Filled 2021-02-24: qty 1

## 2021-02-24 MED ORDER — BUTALBITAL-APAP-CAFFEINE 50-325-40 MG PO TABS: 1.0000 | ORAL_TABLET | Freq: Four times a day (QID) | ORAL | 0 refills | Status: AC | PRN

## 2021-02-24 MED ORDER — IOHEXOL 350 MG/ML SOLN
75.0000 mL | Freq: Once | INTRAVENOUS | Status: AC | PRN
  Administered 2021-02-24: 11:00:00 75 mL via INTRAVENOUS

## 2021-02-24 MED ORDER — METOCLOPRAMIDE HCL 5 MG/ML IJ SOLN
5.0000 mg | Freq: Once | INTRAMUSCULAR | Status: AC
  Administered 2021-02-24: 11:00:00 5 mg via INTRAVENOUS
  Filled 2021-02-24: qty 2

## 2021-02-24 NOTE — ED Notes (Signed)
Tolerating po fluids without n/v, Ambulated in hallway at baseline per daughter, no dizziness,no shortness of breath

## 2021-02-24 NOTE — ED Triage Notes (Signed)
Pt arrived by EMS for headache and htn x 2 days. Reported bp 200 systolic. Denies change in medication regimen or diet. Reports mild nausea.

## 2021-02-24 NOTE — ED Notes (Signed)
Pt ambulated with assistance. Daughter mentioned that pt is walking like she usually does

## 2021-02-24 NOTE — ED Provider Notes (Signed)
Emergency Medicine Provider Triage Evaluation Note  Sarah Whitaker , a 80 y.o. female  was evaluated in triage.  Pt complains of elevated BP with systolic >200 x 2 days with associated HA that "feels like a vice on my head" and some shortness of breath this evening. Endorses compliance with her medicatiosn.   Review of Systems  Positive: HA, SOB, Negative: CP, fevers, decreased urine output  Physical Exam  BP (!) 159/65 (BP Location: Right Arm)    Pulse 76    Temp 98.3 F (36.8 C) (Oral)    Resp 17    SpO2 100%  Gen:   Awake, no distress   Resp:  Normal effort  MSK:   Moves extremities without difficulty  Other:  RRR no m/r/g. No LE edema bilaterally, PERRL, EOMI  Medical Decision Making  Medically screening exam initiated at 12:35 AM.  Appropriate orders placed.  Sarah Whitaker was informed that the remainder of the evaluation will be completed by another provider, this initial triage assessment does not replace that evaluation, and the importance of remaining in the ED until their evaluation is complete.  This chart was dictated using voice recognition software, Dragon. Despite the best efforts of this provider to proofread and correct errors, errors may still occur which can change documentation meaning.    Paris Lore, PA-C 02/24/21 0037    Dione Booze, MD 02/24/21 [phone]

## 2021-02-24 NOTE — Discharge Instructions (Signed)
You have been seen and discharged from the emergency department.  Your CT imaging was negative.  Establish care with neurology for further evaluation of headache/migraines.  Take new medication as prescribed.  Follow-up with your primary provider for reevaluation and further care. Take home medications as prescribed. If you have any worsening symptoms or further concerns for your health please return to an emergency department for further evaluation.

## 2021-02-24 NOTE — ED Provider Notes (Signed)
[facility] EMERGENCY DEPARTMENT Provider Note   CSN: [id number] Arrival date & time: 02/24/21  0023     History No chief complaint on file.   Sarah Whitaker is a 80 y.o. female.  HPI  80 year old female with past medical history of HTN, HLD, vertigo, DM, headaches/migraines, previous history of cerebral aneurysm status postsurgical repair presents the emergency department with concern for elevated blood pressure and headache.  Patient states that this started 2 days ago when she noticed that her systolic was persistently over 200.  She is been compliant with her hypertensive medications but has noticed recently that her blood pressure has been less controlled on her current regimen.  She states that her blood pressure has steadily reduced but she continues to have a worsening headache that she describes as "a vice on her head".  It is not responding to over-the-counter medications.  Is affecting her sleep, worse at night/in the early morning.  Not positional.  No associated neck pain.  Patient denies any acute neurologic symptoms.  No recent illness, fever, vomiting.  Past Medical History:  Diagnosis Date   Anxiety    Depression    Eczema    Headache    Hx of diabetes mellitus    pt states she was told she didn't have to take anything anymore because of her A1C.   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     Family History  Problem Relation Age of Onset   Aneurysm Mother        Brain   Heart disease Father 34   Aneurysm Sister        Brain   Neuromuscular disorder Neg Hx     Social History   Tobacco Use   Smoking status: Former   Smokeless tobacco: Never  Substance Use Topics   Alcohol use: Never   Drug use: Never    Home Medications Prior to Admission medications   Medication Sig Start Date End Date Taking? Authorizing Provider  acetaminophen (TYLENOL) 325 MG tablet Take 650 mg by mouth every 6 (six) hours as needed for moderate pain.   Yes [provider]  alendronate (FOSAMAX) 70 MG tablet Take 70 mg by mouth every Tuesday. 06/02/20  Yes [provider]  Ascorbic Acid (VITAMIN C PO) Take 1,000 mg by mouth daily.   Yes [provider]  ASPIRIN 81 PO Take 81 mg by mouth daily.   Yes [provider]  atorvastatin (LIPITOR) 20 MG tablet Take 20 mg by mouth at bedtime.   Yes [provider]  Azelastine-Fluticasone (DYMISTA) 137-50 MCG/ACT SUSP Place 1 spray into the nose in the morning and at bedtime. For nasal congestion and drainage 07/29/20  Yes Padgett, Pilar Grammes, MD  b complex vitamins  tablet Take 2 tablets by mouth daily.    Yes [provider]  calcium carbonate (TUMS - DOSED IN MG ELEMENTAL CALCIUM) 500 MG chewable tablet Chew 1 tablet by mouth as needed for heartburn or indigestion.   Yes [provider]  diphenhydrAMINE (BENADRYL) 25 MG tablet Take 25 mg by mouth every 6 (six) hours as needed for itching.   Yes [provider]  folic acid (FOLVITE) 1 MG tablet Take 1 mg by mouth daily.   Yes [provider]  Heparin & NaCl Lock Flush (HEPARIN SODIUM FLUSH IV) Inject 1 application into the vein See admin instructions. Given at the end of 1 week per month course of Gammagard   Yes [provider]  Immune Globulin, Human, (GAMMAGARD IV)  Inject 20 g into the vein See admin instructions. Treatment 1 week per month   Yes [provider]  lidocaine (LMX) 4 % cream Apply 1 application topically See admin instructions. Once monthly for port   Yes [provider]  lisinopril-hydrochlorothiazide (ZESTORETIC) 20-25 MG tablet Take 1 tablet by mouth daily.   Yes [provider]  meclizine (ANTIVERT) 25 MG tablet Take 25 mg by mouth 2 (two) times daily as needed for dizziness. 06/02/20  Yes [provider]  methotrexate (RHEUMATREX) 2.5 MG tablet Take 15 mg by mouth every Wednesday. Caution:Chemotherapy. Protect from light. 6 tabs   Yes [provider]  Multiple Vitamin (MULTIVITAMIN ADULT PO) Take 1 tablet by mouth daily. "Living Green" tablet   Yes [provider]  Olopatadine HCl 0.2 % SOLN Apply 1 drop to eye daily as needed (itchy, watery eyes). 07/29/20  Yes Padgett, Rae Halsted, MD  ondansetron (ZOFRAN ODT) 4 MG disintegrating tablet Take 1 tablet (4 mg total) by mouth every 8 (eight) hours as needed for nausea or vomiting. 07/17/19  Yes Fredia Sorrow, MD    Allergies    Red dye, Latex, Penicillins, Sulfa antibiotics, and Tetanus toxoids  Review of Systems   Review of Systems  Constitutional:  Negative for chills and fever.  HENT:  Negative for congestion.   Eyes:  Negative for visual disturbance.  Respiratory:  Negative for shortness of breath.   Cardiovascular:  Negative for chest pain, palpitations and leg swelling.  Gastrointestinal:  Negative for abdominal pain, diarrhea and vomiting.  Genitourinary:  Negative for dysuria.  Musculoskeletal:  Negative for back pain and neck pain.  Skin:  Negative for rash.  Neurological:  Positive for light-headedness and headaches. Negative for dizziness, syncope, facial asymmetry, speech difficulty, weakness and numbness.   Physical Exam Updated Vital Signs BP (!) 157/61 (BP Location: Right Arm)    Pulse 81    Temp 98.6 F (37  C) (Oral)    Resp 18    SpO2 100%   Physical Exam Vitals and nursing note reviewed.  Constitutional:      General: She is not in acute distress.    Appearance: Normal appearance.  HENT:     Head: Normocephalic and atraumatic.     Mouth/Throat:     Mouth: Mucous membranes are moist.  Eyes:     Pupils: Pupils are equal, round, and reactive to light.  Cardiovascular:     Rate and Rhythm: Normal rate.  Pulmonary:     Effort: Pulmonary effort is normal. No respiratory distress.  Abdominal:     Palpations: Abdomen is soft.     Tenderness: There is no abdominal tenderness.  Musculoskeletal:     Cervical back: No  rigidity.  Skin:    General: Skin is warm.  Neurological:     General: No focal deficit present.     Mental Status: She is alert and oriented to person, place, and time. Mental status is at baseline.     Cranial Nerves: No cranial nerve deficit.  Psychiatric:        Mood and Affect: Mood normal.    ED Results / Procedures / Treatments   Labs (all labs ordered are listed, but only abnormal results are displayed) Labs Reviewed  COMPREHENSIVE METABOLIC PANEL - Abnormal; Notable for the following components:      Result Value   Sodium 132 (*)    Glucose, Bld 114 (*)    Calcium 8.8 (*)    Total Protein 9.0 (*)    Albumin 2.9 (*)    Total Bilirubin 1.3 (*)    All other components within normal limits  CBC WITH DIFFERENTIAL/PLATELET - Abnormal; Notable for the following components:   RBC 2.92 (*)    Hemoglobin 10.1 (*)    HCT 30.5 (*)    MCV 104.5 (*)    MCH 34.6 (*)    RDW 21.1 (*)    nRBC 0.4 (*)    Monocytes Absolute 1.1 (*)    Abs Immature Granulocytes 0.11 (*)    All other components within normal limits  LIPASE, BLOOD - Abnormal; Notable for the following components:   Lipase 57 (*)    All other components within normal limits  URINALYSIS, ROUTINE W REFLEX MICROSCOPIC - Abnormal; Notable for the following components:   APPearance HAZY (*)    All other  components within normal limits  BRAIN NATRIURETIC PEPTIDE  TROPONIN I (HIGH SENSITIVITY)  TROPONIN I (HIGH SENSITIVITY)    EKG EKG Interpretation  Date/Time:  Thursday February 24 2021 00:27:54 EST Ventricular Rate:  74 PR Interval:  150 QRS Duration: 82 QT Interval:  372 QTC Calculation: 412 R Axis:   29 Text Interpretation: Normal sinus rhythm Nonspecific T wave abnormality Abnormal ECG When compared with ECG of 07/17/2019, No significant change was found Confirmed by Delora Fuel ([id number]) on 02/24/2021 12:54:12 AM  Radiology CT HEAD WO CONTRAST (5MM)  Result Date: 02/24/2021 CLINICAL DATA:  Headache. EXAM: CT HEAD WITHOUT CONTRAST TECHNIQUE: Contiguous axial images were obtained from the base of the skull through the vertex without intravenous contrast. COMPARISON:  02/22/2019. FINDINGS: Brain: No acute intracranial hemorrhage, midline shift or mass effect. No extra-axial fluid collection. Mild diffuse atrophy is noted. Stable postsurgical changes are noted over the frontal lobe on the left. Periventricular white matter hypodensities are noted bilaterally. There is no hydrocephalus. Metallic artifact is noted in the suprasellar region, possible aneurysm clips. Vascular: No hyperdense vessel or unexpected calcification. Skull: No acute fracture. Craniotomy changes are noted in the frontotemporal region on the left. Sinuses/Orbits: No acute finding. Other: None. IMPRESSION: 1. No acute intracranial process. 2. Stable craniotomy changes on the left and aneurysm clips. 3. Atrophy with chronic microvascular ischemic changes. Electronically Signed   By: Brett Fairy M.D.   On: 02/24/2021 01:34    Procedures Procedures   Medications Ordered in ED Medications  ketorolac (TORADOL) 15 MG/ML injection 15 mg (has no administration in time range)  metoCLOPramide (REGLAN) injection 5 mg (has no administration in time range)  diphenhydrAMINE (BENADRYL) injection 12.5 mg (has no administration in  time range)  sodium chloride 0.9 % bolus 500 mL (has no administration in time range)    ED Course  I have  reviewed the triage vital signs and the nursing notes.  Pertinent labs & imaging results that were available during my care of the patient were reviewed by me and considered in my medical decision making (see chart for details).    MDM Rules/Calculators/A&P                          80 year old female presents emergency department concern for HTN and headache.  She does frequently get headaches with uncontrolled hypertension.  States has been compliant with her medications however she feels the lisinopril medication does not work as effectively as it used to be.  She does have complicated history of cerebral aneurysm in the past that has been surgically corrected.  She is slightly hypertensive here no chest pain, back pain or acute neuro complaint.  Patient's blood pressure has improved without any intervention but the headache persists.  Plain head CT shows no acute finding however given the timeframe of the headache the sensitivity is not acceptable.  We will pursue a CT a imaging of the head and neck and treat for migraine.  She has been prescribed Aricept in the past with decent relief.  Blood work is reassuring, troponin is negative, cardiac work-up was normal.  After migraine cocktail her symptoms have resolved.  CTA imaging shows no acute changes.  She is been able to p.o., ambulate without difficulty as per her baseline.  Plan for outpatient neurology follow-up, will prescribe a short course of Fioricet that she has done well with in the past.  Daughter at bedside agrees with discharge plan.  Patient at this time appears safe and stable for discharge and will be treated as an outpatient.  Discharge plan and strict return to ED precautions discussed, patient verbalizes understanding and agreement.     Final Clinical Impression(s) / ED Diagnoses Final diagnoses:  None    Rx / DC  Orders ED Discharge Orders     None        Lorelle Gibbs, DO 02/24/21 1522

## 2021-03-01 ENCOUNTER — Telehealth: Payer: Self-pay | Admitting: Neurology

## 2021-03-01 ENCOUNTER — Telehealth: Payer: Self-pay | Admitting: *Deleted

## 2021-03-01 NOTE — Telephone Encounter (Signed)
Pt's daughter Phill Mutter (on [place]) called, she is having symptoms headaches, blood pressure. Went to the ER, was referred to [facility], but want her to come to [facility]. Would like a call from the nurse.  Informed Ms. Walker we would need a referral.Ms. Dan Humphreys insisted on sending a message to the nurse.

## 2021-03-01 NOTE — Telephone Encounter (Signed)
Pt daughter called to have neurology referral sent to [facility] as pt is already established with them.  RNCM confirmed with EDP approval to send referral to [facility].  Referral sent via [facility] fax.

## 2021-03-01 NOTE — Telephone Encounter (Signed)
Called patient back spoke with daughter (DPR Checked) Per Dr.Ahern pt needs to call PCP since she is having new symptoms. Dr.Ahern sees her for LE Neuropathy pain. Daughter stated she was having headaches so gave daughter this web site so she can get a virtual visit today (PER Dr.Ahern)  [email or web address]  Pt daughter understood and thanked me for calling her back.

## 2021-03-03 DIAGNOSIS — G4452 New daily persistent headache (NDPH): Secondary | ICD-10-CM | POA: Diagnosis not present

## 2021-03-03 DIAGNOSIS — R2689 Other abnormalities of gait and mobility: Secondary | ICD-10-CM | POA: Diagnosis not present

## 2021-03-09 DIAGNOSIS — R21 Rash and other nonspecific skin eruption: Secondary | ICD-10-CM | POA: Diagnosis not present

## 2021-03-10 ENCOUNTER — Telehealth: Payer: Self-pay | Admitting: *Deleted

## 2021-03-10 ENCOUNTER — Encounter: Payer: Self-pay | Admitting: Neurology

## 2021-03-10 DIAGNOSIS — L509 Urticaria, unspecified: Secondary | ICD-10-CM | POA: Diagnosis not present

## 2021-03-10 NOTE — Telephone Encounter (Signed)
Received a PA request from Cover My Meds for Gammagard KEY: [id number]. I reached out to [place], rep with [facility], to see if they were going to do this or if they needed any information form our office. Will update when I have a response.

## 2021-03-14 ENCOUNTER — Telehealth: Payer: Self-pay | Admitting: Neurology

## 2021-03-14 ENCOUNTER — Encounter: Payer: Self-pay | Admitting: Neurology

## 2021-03-14 DIAGNOSIS — D7589 Other specified diseases of blood and blood-forming organs: Secondary | ICD-10-CM | POA: Diagnosis not present

## 2021-03-14 DIAGNOSIS — I10 Essential (primary) hypertension: Secondary | ICD-10-CM | POA: Diagnosis not present

## 2021-03-14 DIAGNOSIS — E118 Type 2 diabetes mellitus with unspecified complications: Secondary | ICD-10-CM | POA: Diagnosis not present

## 2021-03-14 NOTE — Telephone Encounter (Signed)
[facility] Sarajane Marek(Carmen Harris) have received the approval and moving forward.

## 2021-03-14 NOTE — Telephone Encounter (Addendum)
I called and spoke to [employer], Sarajane Marek.  She states that she needs last OV note (faxed and confirmation received), and then will have [place] step up the plan on CMM KEY.  KeyAllena Earing - PA Case ID: [id number] Initiated 02/23/21.

## 2021-03-14 NOTE — Telephone Encounter (Signed)
Pt's daughter has called to report that the insurance company told her that Dr Lucia Gaskins will have to call and authorize the medication to be expedited, is suppose to start the infusion medication today.  Please call daughter.

## 2021-03-14 NOTE — Telephone Encounter (Signed)
Thanks for the update! I also updated pt's daughter, Joni Reiningicole.

## 2021-03-15 ENCOUNTER — Telehealth: Payer: Self-pay | Admitting: Neurology

## 2021-03-15 NOTE — Telephone Encounter (Signed)
I called daughter today, our staff called her and patient's daughter was upset I think it was a misunderstanding we are happy to see her mother I told her her our staff was just really trying to help her and they were just the messenger, but she really needs to speak with me if she is unclear or if we made it unclear. I am not sure we need another EMG nerve conduction study, I think we have been seeing her mother for quite some time now without a diagnosis and I do suggest that she sees an academic center or another neuromuscular specialist, IVIG was initiated even prior to my seeing her and appears to help her but no diagnosis was ever specifically made.  Patient is having headaches which are actually resolving which is great.  I asked patient to call me back.  I also sent her a MyChart message which it does not appear as though she read thank you.

## 2021-03-16 ENCOUNTER — Telehealth: Payer: Self-pay | Admitting: Neurology

## 2021-03-16 DIAGNOSIS — G629 Polyneuropathy, unspecified: Secondary | ICD-10-CM | POA: Diagnosis not present

## 2021-03-16 NOTE — Telephone Encounter (Signed)
noted 

## 2021-03-16 NOTE — Telephone Encounter (Signed)
Ricky with [facility] called wanting to let the doctor know that pt's insurance delayed her IVIG infusion. Clide CliffRicky states they will see the pt next week only for 4 days due to Monday being a holiday but will still give pt her normal dosage. Clide CliffRicky states after next week they will resume pt's normal schedule. Ricky's call back number is [phone].

## 2021-03-22 DIAGNOSIS — G6181 Chronic inflammatory demyelinating polyneuritis: Secondary | ICD-10-CM | POA: Diagnosis not present

## 2021-03-23 DIAGNOSIS — G6181 Chronic inflammatory demyelinating polyneuritis: Secondary | ICD-10-CM | POA: Diagnosis not present

## 2021-03-24 DIAGNOSIS — G6181 Chronic inflammatory demyelinating polyneuritis: Secondary | ICD-10-CM | POA: Diagnosis not present

## 2021-03-25 DIAGNOSIS — I10 Essential (primary) hypertension: Secondary | ICD-10-CM | POA: Diagnosis not present

## 2021-03-25 DIAGNOSIS — M810 Age-related osteoporosis without current pathological fracture: Secondary | ICD-10-CM | POA: Diagnosis not present

## 2021-03-25 DIAGNOSIS — R2681 Unsteadiness on feet: Secondary | ICD-10-CM | POA: Diagnosis not present

## 2021-03-25 DIAGNOSIS — G6181 Chronic inflammatory demyelinating polyneuritis: Secondary | ICD-10-CM | POA: Diagnosis not present

## 2021-03-25 DIAGNOSIS — J302 Other seasonal allergic rhinitis: Secondary | ICD-10-CM | POA: Diagnosis not present

## 2021-03-25 DIAGNOSIS — E782 Mixed hyperlipidemia: Secondary | ICD-10-CM | POA: Diagnosis not present

## 2021-03-25 DIAGNOSIS — L509 Urticaria, unspecified: Secondary | ICD-10-CM | POA: Diagnosis not present

## 2021-03-30 DIAGNOSIS — R0602 Shortness of breath: Secondary | ICD-10-CM | POA: Diagnosis not present

## 2021-03-30 DIAGNOSIS — B349 Viral infection, unspecified: Secondary | ICD-10-CM | POA: Diagnosis not present

## 2021-04-06 ENCOUNTER — Encounter: Payer: Self-pay | Admitting: Neurology

## 2021-04-07 ENCOUNTER — Encounter: Payer: Self-pay | Admitting: Neurology

## 2021-04-07 ENCOUNTER — Ambulatory Visit: Payer: Medicare Other | Admitting: Neurology

## 2021-04-07 VITALS — BP 139/65 | HR 73 | Ht 62.5 in | Wt 195.0 lb

## 2021-04-07 DIAGNOSIS — M6281 Muscle weakness (generalized): Secondary | ICD-10-CM | POA: Diagnosis not present

## 2021-04-07 DIAGNOSIS — G6289 Other specified polyneuropathies: Secondary | ICD-10-CM | POA: Diagnosis not present

## 2021-04-07 NOTE — Patient Instructions (Addendum)
Blood work today Have Peripheral Artery disease evaluated EMG/NCS - will refer to neuromuscular specialist in [facility] Muscle biopsy - will refer to neuromuscular specialist in [facility] Physical therapy Repeat MRI lumbar spine and thoracic spine - will refer to neuromuscular specialist Work on getting her an appointment in early April  Leaving march 22nd, last IVIG is March 13th, we need her at another neurologist beginning of April, we will call the larger university/academic systems and see if we can get that completed. Can we get her IVIG scheduled in [place] - can we do that? Will find out. Vicente Serene, [place]

## 2021-04-07 NOTE — Progress Notes (Signed)
[facility]    Provider:  Dr Jaynee Eagles Requesting Provider: Caren Macadam, MD Primary Care Provider:  Caren Macadam, MD  Dr. Dossie Der   CC:  Neuropathy  Patient History: This is a patient I started seeing in July 2020 as a transfer from Dr. Wellington Hampshire from [facility], [facility].  Patient has been receiving IVIG treatments every 4 weeks since prior to her transfer into my care.  She will be receiving her IVIG May 16, 2021 and our main goal is to transition her seamlessly to the [facility] area without interruption in her IVIG treatment which will be due approximately June 16 2021.  We are trying to transition patient o Dr. Hetty Ely in [place].  I initially saw patient in July 2020 for polyneuropathy treated with IVIG.  She has a past medical history of diabetes, remote history of cerebral aneurysm status post surgical repair, Rheumatoid arthritis on methotrexate, hyperlipidemia, hypertension, peripheral artery disease, vertigo, remote migraines.  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  Per history, patient developed acute onset lower extremity weakness to the point where she could not walk, and extensive evaluation was completed by Dr. Wellington Hampshire (including serum blood work, lumbar puncture, MRIs and EMG nerve conduction studies) who diagnosed her with the above and started her on IVIG.  When I initially saw patient she had extensively improved and had gone from being wheelchair-bound to walking with a cane.  The disorder did not affect her upper extremities and she never reported any sensory changes or numbness.  The weakness started in January 2020 and in March 2020 she developed relatively acute to subacute onset progressive weakness in both lower extremities without any clear provocation.  Dr. Wellington Hampshire noted no changes in sensation such as tingling or numbness, no significant pain in the legs or in the back, she  underwent a laboratory evaluation which was largely normal with a CK of 325; neurologic exam at the time Wellington Hampshire performed it was unremarkable except for 4/5 strength in the bilateral lower extremities without atrophy or fasciculations noted and intact to light touch pinprick and vibration, deep tendon reflexes absent in ankles but otherwise 2+.  He performed serum testing, EMG nerve conduction study and MRI of the lumbar spine.  Lumbar puncture was largely unremarkable as well.  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  She was started on IVIG and improved significantly.  When I saw initially her she was doing well, so we continued her on her IVIG.  Over the years I kept an eye on her CK, checked several other labs such as B12, multiple myeloma, TSH, hemoglobin A1c, B6, heavy metals, RPR, sed rate, B1 and she continued to be evaluated and treated by rheumatology with methotrexate for her rheumatoid arthritis.  She seemed to do well on IVIG every 4 weeks.  Trying to increase the duration between treatments made her feel worse, and trying to administer the IVIG over less than 5 days also gave her side effects.  So over the years we continued IVIG every 4 weeks over 5 days.  She eventually received a port and did well.  We sent her to physical therapy and overall she continued to remain stable.  Over the years I was concerned about the lack of a more specific diagnosis, but given she was doing well, we continued to monitor and provide her the treatment and did  not repeat EMG nerve conduction study.  My exam always showed normal sensation and mild proximal weakness.  As of very recently, she reports more proximal weakness, more difficulty with gait, gait described as "waddling " by daughter, sensation still remains intact distally to light touch, pinprick, vibration and proprioception with  exam showing some slightly worsened proximal weakness.  I also did notice on her last exam that her lower limbs felt cold with distal rubor and advised her to follow-up with her primary care about her peripheral artery disease.  Her IVIG has remained at the same dose, we recently decreased a pre-treatment bolus we were giving her from 500 mL to 125 mL (this helped with headaches during her IVIG treatments) due to swelling during the treatments.   Repeat EMG/NCS: 06/07/2020: There is electrophysiologic evidence for a severe distal, length-dependent, axonal, predominantly-motor polyneuropathy with acute/ongoing denervation and chronic neurogenic changes in distal muscles of the lower extremities.     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  She has been seeing [place] for lower extremity weakness diagnosed autoimmune prior to seeing me, at last appointment she stated she was not doing as well as before on the IVIG, I ordered MRI lumbar spine and thoracic spine, doesn't appear it was completed.  She is on methotrexate for her rheumatoid arthritis.   She has been having headaches. I reviewed chart February 24, 2021, she was seen in the emergency room and reported her systolic was persistently over 200, despite compliance with her hypertensive medications, her blood pressure has been less controlled on her current regimen, worsening headache like of ice on her head, not responding to over-the-counter medications, is affecting sleep, worse at night or in the early morning, not positional no associated neck pain no acute neurologic symptoms.  Her labs showed  borderline sodium 132, anemia hemoglobin 10.1, elevated MCV at 104.  Patient reported she does get frequent headaches with uncontrolled hypertension.  CT of the head showed no acute findings.  Cardiac work-up was normal per ED physician, a migraine cocktail resolved her symptoms.  CTA imaging showed no acute changes.  CT head 02/2021:  IMPRESSION: 1. No acute intracranial process. 2. Stable craniotomy changes on the left and aneurysm clips. 3. Atrophy with chronic microvascular ischemic changes.  CT Angiography: IMPRESSION: No large vessel occlusion, hemodynamically significant stenosis, or evidence of dissection.   Post aneurysm clipping in the region of the anterior communicating artery. No aneurysm.    12/09/2020: Patient reports that when we tried pushing out her IVIG to 5 or 6 weeks she progressively worsened so her IVIG was brought back to every 3 weeks.  She is not having as many side effects because she gets a fluid bolus before the IVIG.  She still having a lot of difficulty with weakness in her legs and walking and stamina, she is going to [facility] physical therapy on [address] I will contact them and put in an additional order to not only work with her left arm but also to help with strengthening, stamina and and gait.  She has stiffness in her hands but no sensory changes likely her rheumatoid arthritis.  She has left shoulder pain and she has been getting injections the pain does not radiate from her neck and  it does not radiate down her arm it appears to be very localized to the left shoulder so it does not sound as though she has any cervical radiculopathy and no sensory changes in the hands very unlikely carpal tunnel syndrome.  But given she continues to have weakness and sensory changes in her lower extremities I think a repeat of the MRI lumbar spine adding an MRI of the thoracic spine as well would be prudent.  She has had extensive testing in the past, the next time she is here  we should order an IFE with SPEP repeat.  f/u Thursday afternoon next appt - will call  Patient complains of symptoms per HPI as well as the following symptoms: left shoulder pain s/p injections . Pertinent negatives and positives per HPI. All others negative   05/06/2020: Patient is here with daughter. She still feels she needs IVIG. We discussed pushing the IVIG out to 5 weeks to see how she does nd then maybe 6 weeks if possible to get her off of the IVIG. Also she gets it over 5 days, try to reduce to 3 days to make it less cumbersome for her. She reports tingling int he fingers, may consider emg/ncs and could check one leg as well and the uppers. Home PT/OT to help with gait and strength.   Interval history May 06, 2020: Patient has been following in our office and having regular appointments with Debbora Presto, NP, today I'm seeing her today. She did get a port and is doing well with this. She has been followed by PCP closely. She has been more sedentary. But overall she has been doing very well for polyneuropathy. When last seen by Debbora Presto in August 2021 she had a fall but it was not related to her polyneuropathy and we recommended physical therapy for strength and gait evaluation.  Interval update 02/15/2019:  She is here for followup on neuropathy and IVIG: Her vertigo is better. She went to PT for vestibular therapy and the vertigo was better. The second time she went her pulse went up so she did not go back. Overall she continues to improve with neuropathy and vertigo, she feels a little weak after IVIG and it takes some time to feel better, it take her 3 days to get back to herself, bu tthen she feels great. She is a hard stick, since we will be continuing this we will insert a  port, it is terrible getting the injections per patient and she would like a port. No difference being off of the statin, restart. Feels better on iron. She worries about her memory, no family history Discussed normal  cognitive aging. Will keep IVIG every month over 5 days, discussed increasing time in between IVIG treatments from 4 to 5 weeks when she is read and also decreasing how many days it takes to get her IVIG from 5 to 3 but right now will leave it as is.     HPI:  Sarah Whitaker is a 81 y.o. female here as requested by Caren Macadam, MD for neuropathy.  Past medical history diabetes, rheumatoid arthritis, hyperlipidemia, hypertension. 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 She can walk now, not using the cane as much, She never had pain more weakness in the legs, started acutely confirmed by extensive workup by neurology in [facility]. She still feels weak but significantly improved. She never had sensory changes, all weakness, in the legs  but not in the hands and did not affect the hands. Denies any numbness. Ankles stil stiff. Prior to acute symptoms no changes, no changes in medications, no falls. Doing well otherwise.   Reviewed notes, labs and imaging from outside physicians, which showed:  Reviewed notes from neurology consultants of Dr. Wellington [facility] in [place].  Patient is new to the area and is establishing care.  Patient presented with bilateral acute leg weakness.  She has a past medical history of hyperlipidemia, diabetes and rheumatoid arthritis who presented for evaluation of new weakness in March 2020.  In January 2020 she developed relatively acute to subacute onset of weakness in both lower extremities.  This began without any clear provocation but is worsened over the month.  Now experiencing moderately severe constant and chronic weakness of both lower extremities.  Her weakness is worse when she is on her feet and fully goes away.  She is having to use cane to walk and prior to January she was able to walk without any assistance.  No family history of similar things.  She has  no prior history of similar symptoms.  No changes in sensation such as weakness, tingling or numbness.  No significant pain in the back of the legs themselves.  Right side appears more affected than the left but both sides are affected.  She underwent a laboratory evaluation largely normal with a CK of 325.  She is on lovastatin and methotrexate.  She is retired.  She is right-handed.  Neurologic exam was unremarkable except for 4- out of 5 strength in the bilateral tibialis anterior, no atrophy or fasciculations noted, intact to light touch, pinprick and vibration, no dysmetria, normal toe and heel walking normal tandem great, absent at ankles but otherwise DTRs 2+.  EMG nerve conduction study was obtained.  So was MRI of the lumbar spine.  She is on methotrexate for rheumatoid arthritis.  She was continued on lovastatin.  Personally reviewed MRI of the low spine which showed as above noted.  Reviewed EMG nerve conduction study data the bilateral tibialis anterior muscles and medial gastroc muscles demonstrated 2+ to 3+ insertional activity with markedly reduced recruitment and increased motor unit amplitude and duration.  The bilateral vastus lateralis and vastus medialis muscles demonstrated rare insertional activity with mildly increased motor unit amplitudes.  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  CTA of the head showed no acute intracranial abnormalities, mild chronic senescent changes, remote left craniotomy and underlying aneurysm repair changes.  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  EMG nerve conduction study showed severe, active on chronic sensorimotor polyneuropathy, inflammatory or autoimmune, toxic or metabolic, or infectious etiology would appear most likely.  A lumbar puncture was ordered and lab work was  ordered.  Lumbar puncture showed slightly elevated glucose at 57.  Labs ordered included ANA screen positive titer and pattern 1-1 60 speckled, double-stranded DNA 48 c-ANCA negative, p-ANCA negative, CRP 2.1, ESR 78, serum immunoelectrophoresis showed a questionable faint monoclonal band present in the gamma region of IgG kappa, Lyme disease negative, paraneoplastic antibody found neuronal ACH R ganglionic neuronal antibodies high. Review of Systems: Patient complains of symptoms per HPI as well as the following symptoms: imbalance, needs walking aid.  Pertinent negatives and positives per HPI. All others negative    Social History  Socioeconomic History   Marital status: Divorced    Spouse name: Not on file   Number of children: 2   Years of education: Not on file   Highest education level: Not on file  Occupational History    Comment: retired  Tobacco Use   Smoking status: Former   Smokeless tobacco: Never  Substance and Sexual Activity   Alcohol use: Never   Drug use: Never   Sexual activity: Not Currently  Other Topics Concern   Not on file  Social History Narrative   Lives at home with her daughter, will be moving to [place], [place].    Right handed   Caffeine: 1/2 and 1/2 coffee daily 1 cup   Social Determinants of Health   Financial Resource Strain: Not on file  Food Insecurity: Not on file  Transportation Needs: Not on file  Physical Activity: Not on file  Stress: Not on file  Social Connections: Not on file  Intimate Partner Violence: Not on file    Family History  Problem Relation Age of Onset   Aneurysm Mother        Brain   Heart disease Father 11   Aneurysm Sister        Brain   Neuromuscular disorder Neg Hx     Past Medical History:  Diagnosis Date   Anxiety    Depression    Eczema    Headache    Hx of diabetes mellitus    pt states she was told she didn't have to take anything anymore because of her A1C.   Hyperlipidemia    Hypertension     Neuropathy    Rheumatoid aortitis    Vertigo     Patient Active Problem List   Diagnosis Date Noted   Murmur 04/01/2019   SOB (shortness of breath) 04/01/2019   Educated about COVID-19 virus infection 04/01/2019   Polyneuropathy 12/16/2018   Long-term current use of intravenous immunoglobulin (IVIG) 12/16/2018   Obesity (BMI 30-39.9) 12/16/2018    Past Surgical History:  Procedure Laterality Date   APPENDECTOMY     BRAIN SURGERY     Repair of an aneurysm   IR IMAGING GUIDED PORT INSERTION  01/01/2019   SHOULDER SURGERY Right    TONSILLECTOMY      Current Outpatient Medications  Medication Sig Dispense Refill   alendronate (FOSAMAX) 70 MG tablet Take 70 mg by mouth every Tuesday.     Ascorbic Acid (VITAMIN C PO) Take 1,000 mg by mouth daily.     ASPIRIN 81 PO Take 81 mg by mouth daily.     atorvastatin (LIPITOR) 20 MG tablet Take 20 mg by mouth at bedtime.     Azelastine-Fluticasone (DYMISTA) 137-50 MCG/ACT SUSP Place 1 spray into the nose in the morning and at bedtime. For nasal congestion and drainage 23 g 5   b complex vitamins tablet Take 2 tablets by mouth daily.      butalbital-acetaminophen-caffeine (FIORICET) 50-325-40 MG tablet Take 1 tablet by mouth every 6 (six) hours as needed for headache. 20 tablet 0   calcium carbonate (TUMS - DOSED IN MG ELEMENTAL CALCIUM) 500 MG chewable tablet Chew 1 tablet by mouth as needed for heartburn or indigestion.     diphenhydrAMINE (BENADRYL) 25 MG tablet Take 25 mg by mouth every 6 (six) hours as needed for itching.     folic acid (FOLVITE) 1 MG tablet Take 1 mg by mouth daily.     Heparin & NaCl Lock Flush (HEPARIN SODIUM  FLUSH IV) Inject 1 application into the vein See admin instructions. Given at the end of 1 week per month course of Gammagard     Immune Globulin, Human, (GAMMAGARD IV) Inject 20 g into the vein See admin instructions. Treatment 1 week per month     lidocaine (LMX) 4 % cream Apply 1 application topically See admin  instructions. Once monthly for port     meclizine (ANTIVERT) 25 MG tablet Take 25 mg by mouth 2 (two) times daily as needed for dizziness.     methotrexate (RHEUMATREX) 2.5 MG tablet Take 15 mg by mouth every Wednesday. Caution:Chemotherapy. Protect from light. 6 tabs     Multiple Vitamin (MULTIVITAMIN ADULT PO) Take 1 tablet by mouth daily. "Living Green" tablet     olmesartan (BENICAR) 20 MG tablet Take 20 mg by mouth daily.     Olopatadine HCl 0.2 % SOLN Apply 1 drop to eye daily as needed (itchy, watery eyes). 2.5 mL 5   ondansetron (ZOFRAN ODT) 4 MG disintegrating tablet Take 1 tablet (4 mg total) by mouth every 8 (eight) hours as needed for nausea or vomiting. 12 tablet 1   No current facility-administered medications for this visit.    Allergies as of 04/07/2021 - Review Complete 04/07/2021  Allergen Reaction Noted   Red dye Itching 12/25/2018   Latex Rash 09/26/2018   Penicillins Rash 09/26/2018   Sulfa antibiotics Rash 09/26/2018   Tetanus toxoids Rash 09/26/2018    Vitals: BP 139/65 (BP Location: Left Arm, Patient Position: Sitting)    Pulse 73    Ht 5' 2.5" (1.588 m)    Wt 195 lb (88.5 kg)    BMI 35.10 kg/m  Last Weight:  Wt Readings from Last 1 Encounters:  04/07/21 195 lb (88.5 kg)   Last Height:   Ht Readings from Last 1 Encounters:  04/07/21 5' 2.5" (1.588 m)   Exam: Gen: NAD, conversant, well nourised, obese, well groomed                     CV: RRR, no MRG. No Carotid Bruits. No peripheral edema, warm, nontender Eyes: Conjunctivae clear without exudates or hemorrhage  Neuro: no changes Detailed Neurologic Exam  Speech:    Speech is normal; fluent and spontaneous with normal comprehension.  Cognition:    The patient is oriented to person, place, and time;     recent and remote memory intact;     language fluent;     normal attention, concentration,     fund of knowledge Cranial Nerves:    The pupils are equal, round, and reactive to light.  Pupils too  small to visualize fundi today but have been normal in the past.  Visual fields are full to finger confrontation. Extraocular movements are intact. Trigeminal sensation is intact and the muscles of mastication are normal. The face is symmetric. The palate elevates in the midline. Hearing intact. Voice is normal. Shoulder shrug is normal. The tongue has normal motion without fasciculations.   Gait: Good arm swing, short steps and slightly wide based but erect and balance stable   Motor Observation:   Mouth movements Tone:    Normal muscle tone.    Posture:    Posture is normal. normal erect    Strength: Mild proximal bilateral weakness hip flexion 4/5 and Bilateral delt,biceps and triceps 4/5    Sensation: -sensation intact to pin prick distally, she can feel a cool vibration stick distally, 6 seconds vibration at great  toes,   Reflex Exam:  DTR's:    Absent Ajs, 1+ patellars, 1+-2+ uppers.  Toes:    The toes are equiv bilaterally.   Clonus:    Clonus is absent.    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  She has been seeing [place] for lower extremity weakness diagnosed autoimmune prior to seeing me, at last appointment she stated she was not doing as well as before on the IVIG, I ordered MRI lumbar spine and thoracic spine, doesn't appear it was completed.  She is on methotrexate for her rheumatoid arthritis. She was previously managed by neurology for acute to subacute onset of weakness in the legs, EMG nerve conduction study demonstrated a relatively severe, active and chronic sensorimotor  (primarily motor) polyneuropathy, and inflammatory/autoimmune/toxic/metabolic and infectious work-up was completed which revealed elevated ESR CRP, positive ANA and weakly positive SPEP IgG likely related to her known rheumatoid arthritis.  Also found to have a mildly elevated a CHR ganglionic neuronal antibody.  Her LP showed elevated protein of 57 but otherwise normal.  She was treated with IVIG and did well  -  Patient may need a repeat EMG nerve conduction study and I do not believe she has ever had a biopsy which may be helpful to see if this is polymyositis or other neuromuscular disease.  A repeat MRI of the lumbar spine may also be helpful.  However considering that patient is moving to [place], I would much rather have these procedures completed with her new neurologist:  - Blood work today (CK 66, AchR antibodies negative) - Have Peripheral Artery disease evaluated - EMG/NCS - will refer to neuromuscular specialist in [place] - consider Muscle biopsy - will refer to neuromuscular specialist in [facility] - Physical therapy - consider Repeat MRI lumbar spine and thoracic spine - will refer to neuromuscular specialist - Leaving march 22nd, last IVIG is March 13th, referred and sent a letter to Vicente Serene, [place]    Cc: Caren Macadam, MD,  Sarina Ill, MD  [facility] [address] [place], [place] [address]  Phone [phone] Fax [phone]  I spent over 130 minutes of face-to-face and non-face-to-face time with patient on the  1. Proximal limb muscle weakness   2. Motor polyneuropathy    diagnosis.  This included previsit chart review, lab review, study review, order entry, electronic health record documentation, patient education on the different diagnostic and therapeutic options, counseling and coordination of care, risks and benefits of management, compliance, or risk factor reduction

## 2021-04-11 ENCOUNTER — Telehealth: Payer: Self-pay | Admitting: Neurology

## 2021-04-11 NOTE — Telephone Encounter (Signed)
Secure message sent to [facility] w/ [employer] confirming saline bolus volume. Will update as soon as I hear back.

## 2021-04-11 NOTE — Telephone Encounter (Signed)
Patient is becoming fluid overloaded during her IVIG sessions. We started giving her a bolus every time due to a headache but now she is getting swelling. We want to keep the IVIG to 5 days every 4 week. But Can you find out how much IV fluids we give her before her infusion and make sure is and not or ? Her next infusion is coming up soon if we can call today or tomorrow thanks. And let daughter know what we find out.

## 2021-04-12 ENCOUNTER — Encounter: Payer: Self-pay | Admitting: Neurology

## 2021-04-12 LAB — ACETYLCHOLINE RECEPTOR, BLOCKING: Acetylchol Block Ab: 16 % (ref 0–25)

## 2021-04-12 LAB — CK: Total CK: 66 U/L (ref 32–182)

## 2021-04-12 LAB — ACETYLCHOLINE RECEPTOR, BINDING: AChR Binding Ab, Serum: <0.03 nmol/L (ref 0.00–0.24)

## 2021-04-12 NOTE — Telephone Encounter (Signed)
Received a response from Sarah Whitaker.  Patient currently receives a bolus of 500 mL of normal saline over 1 hour (or slower as tolerated by the patient).  I provided [name] with a verbal order from Dr Lucia GaskinsAhern to change the saline bolus to 125 mL over 1 hour instead. I also updated the pt's daughter.

## 2021-04-13 ENCOUNTER — Encounter: Payer: Self-pay | Admitting: *Deleted

## 2021-04-13 ENCOUNTER — Telehealth: Payer: Self-pay | Admitting: *Deleted

## 2021-04-13 DIAGNOSIS — M1990 Unspecified osteoarthritis, unspecified site: Secondary | ICD-10-CM | POA: Diagnosis not present

## 2021-04-13 DIAGNOSIS — M069 Rheumatoid arthritis, unspecified: Secondary | ICD-10-CM | POA: Diagnosis not present

## 2021-04-13 DIAGNOSIS — M25512 Pain in left shoulder: Secondary | ICD-10-CM | POA: Diagnosis not present

## 2021-04-13 DIAGNOSIS — Z79899 Other long term (current) drug therapy: Secondary | ICD-10-CM | POA: Diagnosis not present

## 2021-04-13 DIAGNOSIS — D649 Anemia, unspecified: Secondary | ICD-10-CM | POA: Diagnosis not present

## 2021-04-13 DIAGNOSIS — M12812 Other specific arthropathies, not elsewhere classified, left shoulder: Secondary | ICD-10-CM | POA: Diagnosis not present

## 2021-04-13 NOTE — Telephone Encounter (Signed)
Request faxed to Dr Wellington [place]

## 2021-04-14 ENCOUNTER — Telehealth: Payer: Self-pay | Admitting: Neurology

## 2021-04-14 NOTE — Telephone Encounter (Signed)
Sent the letter from Dr. Lucia Gaskins to:  Melvyn Novas, MD [facility] [address], [address], [place], [place] [address] [phone] Fax [phone]

## 2021-04-18 DIAGNOSIS — G629 Polyneuropathy, unspecified: Secondary | ICD-10-CM | POA: Diagnosis not present

## 2021-04-18 DIAGNOSIS — G6181 Chronic inflammatory demyelinating polyneuritis: Secondary | ICD-10-CM | POA: Diagnosis not present

## 2021-04-19 DIAGNOSIS — G629 Polyneuropathy, unspecified: Secondary | ICD-10-CM | POA: Diagnosis not present

## 2021-04-20 DIAGNOSIS — G6181 Chronic inflammatory demyelinating polyneuritis: Secondary | ICD-10-CM | POA: Diagnosis not present

## 2021-04-21 DIAGNOSIS — G6181 Chronic inflammatory demyelinating polyneuritis: Secondary | ICD-10-CM | POA: Diagnosis not present

## 2021-04-22 DIAGNOSIS — G6181 Chronic inflammatory demyelinating polyneuritis: Secondary | ICD-10-CM | POA: Diagnosis not present

## 2021-05-11 ENCOUNTER — Telehealth: Payer: Self-pay | Admitting: *Deleted

## 2021-05-11 NOTE — Telephone Encounter (Signed)
Received fax from [facility] infusion stating patient has refused IVIG premeds of acetaminophen 325 mg PO and diphenhydramine 25 mg PO.  They would like to keep dose the same but make these PRN instead.  Order has been signed by Dr. Lucia GaskinsAhern and faxed back to [facility] infusion [phone]. Received a receipt of confirmation. ? ?

## 2021-05-16 DIAGNOSIS — G629 Polyneuropathy, unspecified: Secondary | ICD-10-CM | POA: Diagnosis not present

## 2021-05-16 DIAGNOSIS — G6181 Chronic inflammatory demyelinating polyneuritis: Secondary | ICD-10-CM | POA: Diagnosis not present

## 2021-05-17 DIAGNOSIS — G6181 Chronic inflammatory demyelinating polyneuritis: Secondary | ICD-10-CM | POA: Diagnosis not present

## 2021-05-18 DIAGNOSIS — G6181 Chronic inflammatory demyelinating polyneuritis: Secondary | ICD-10-CM | POA: Diagnosis not present

## 2021-05-19 DIAGNOSIS — G6181 Chronic inflammatory demyelinating polyneuritis: Secondary | ICD-10-CM | POA: Diagnosis not present

## 2021-05-20 DIAGNOSIS — G6181 Chronic inflammatory demyelinating polyneuritis: Secondary | ICD-10-CM | POA: Diagnosis not present

## 2021-05-25 NOTE — Telephone Encounter (Signed)
Referral sent to Dr. Obie Dredge [phone]. ?

## 2021-06-24 ENCOUNTER — Telehealth: Payer: Self-pay | Admitting: Neurology

## 2021-06-24 NOTE — Telephone Encounter (Signed)
Spoke with Trinna Post in infusion suite of Dr. Obie Dredge [facility], [facility] ?Pt has moved to [facility] and is now getting infusion with Dr. Obie Dredge practice.  ?Practice requesting information on dosage and administration of previous IV infusion treatments at [facility] office.  ?Would like a call back. ? ?[phone]) alex or Liborio Nixon  ?

## 2021-06-27 NOTE — Telephone Encounter (Signed)
I called Mary at [facility]) [phone] concerning the request for the information  on dosage and administration of previous IV infusion treatments from Dr. Fredrik Cove office.  ( IVIG (Gammaguard 180grams over 5 days in 118ml 0.9% saline. (Last 05/16/2021 thru 05/20/21).  04/18/21 thru 04/22/21.  IVIG premeds of acetaminophen 325 mg PO and diphenhydramine 25 mg PO.  Taking only if she needs it (PRN).  Stanton Kidney will check with infusion/nursing about getting [place] records of her last 3 infusion notes.  (Noted have IVF change and PRN premed changes).  Found thru records that we have last POT/POC records thru 04-02-2021 to 06/30/2021 (this chg from 59ml to 128ml NACL prior to each infusion, with premeds made prn.  I LMVM for janice or alex at Dr Fredrik Cove office that returned call can fax to them last order notes/ and changes.   ?

## 2021-06-28 NOTE — Telephone Encounter (Signed)
Sarah Whitaker returned the call. He provided this fax number for our office to send the most recent IVIG records/orders to: [phone]. Records have been faxed. Received a receipt of confirmation. ? ?

## 2021-06-28 NOTE — Telephone Encounter (Signed)
Received call back from Janece from Infusion at Dr. Raynelle Chary office.  She did not get all the information she needed , I refaxed the information.  I  relayed to janece the last 2 infusion dates ( IVIG (Gammaguard 180grams over 5 days in 0.9% saline. (Last 05/16/2021 thru 05/20/21).  04/18/21 thru 04/22/21.  Those had been given via [facility] via [facility].  I called her to make sure she had everything she needed.  She appreciated follow up  ?
